# Patient Record
Sex: Male | Born: 1990
Health system: Southern US, Community
[De-identification: ages and names within clinical notes are randomized; demographics above are authoritative.]

## PROBLEM LIST (undated history)

## (undated) DIAGNOSIS — F909 Attention-deficit hyperactivity disorder, unspecified type: Secondary | ICD-10-CM

## (undated) HISTORY — DX: Attention-deficit hyperactivity disorder, unspecified type: F90.9

---
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
## 2011-01-25 ENCOUNTER — Other Ambulatory Visit: Payer: Self-pay | Admitting: Family Medicine

## 2011-01-25 MED ORDER — AMPHETAMINE-DEXTROAMPHET ER 30 MG PO CP24: 60.0000 mg | ORAL_CAPSULE | ORAL | Status: DC

## 2011-01-25 NOTE — Telephone Encounter (Signed)
Refill adderall - patient will pick up - Friday [id number]

## 2011-01-25 NOTE — Telephone Encounter (Signed)
Rx left at check in.      KP 

## 2011-02-27 ENCOUNTER — Other Ambulatory Visit: Payer: Self-pay | Admitting: Family Medicine

## 2011-02-28 MED ORDER — AMPHETAMINE-DEXTROAMPHET ER 30 MG PO CP24: 60.0000 mg | ORAL_CAPSULE | ORAL | Status: DC

## 2011-02-28 NOTE — Telephone Encounter (Signed)
Rx left at check In    KP 

## 2011-04-02 ENCOUNTER — Other Ambulatory Visit: Payer: Self-pay | Admitting: Family Medicine

## 2011-04-02 MED ORDER — AMPHETAMINE-DEXTROAMPHET ER 30 MG PO CP24: 60.0000 mg | ORAL_CAPSULE | ORAL | Status: DC

## 2011-04-02 NOTE — Telephone Encounter (Signed)
Refill adderall xr 30 mg - 2 tablets daily - patient will pick up Wednesday [id number]

## 2011-04-02 NOTE — Telephone Encounter (Signed)
Printed and left at check in    KP 

## 2011-05-03 ENCOUNTER — Telehealth: Payer: Self-pay | Admitting: Family Medicine

## 2011-05-03 NOTE — Telephone Encounter (Signed)
Refill adderall - patient will pick up Friday afternoon - [id number] °

## 2011-05-04 MED ORDER — AMPHETAMINE-DEXTROAMPHET ER 30 MG PO CP24: 60.0000 mg | ORAL_CAPSULE | ORAL | Status: DC

## 2011-05-04 NOTE — Telephone Encounter (Signed)
Rx left at check    KP 

## 2012-02-07 ENCOUNTER — Other Ambulatory Visit (HOSPITAL_COMMUNITY)
Admission: RE | Admit: 2012-02-07 | Discharge: 2012-02-07 | Disposition: A | Discharge status: Home / Self Care | Payer: Medicaid Other | Source: Ambulatory Visit | Attending: Family Medicine | Admitting: Family Medicine

## 2012-02-07 ENCOUNTER — Ambulatory Visit (INDEPENDENT_AMBULATORY_CARE_PROVIDER_SITE_OTHER): Payer: Medicaid Other | Admitting: Family Medicine

## 2012-02-07 ENCOUNTER — Encounter: Payer: Self-pay | Admitting: Family Medicine

## 2012-02-07 VITALS — BP 136/67 | HR 83 | Ht 74.0 in | Wt 160.0 lb

## 2012-02-07 DIAGNOSIS — F909 Attention-deficit hyperactivity disorder, unspecified type: Secondary | ICD-10-CM

## 2012-02-07 DIAGNOSIS — R198 Other specified symptoms and signs involving the digestive system and abdomen: Secondary | ICD-10-CM

## 2012-02-07 DIAGNOSIS — Z202 Contact with and (suspected) exposure to infections with a predominantly sexual mode of transmission: Secondary | ICD-10-CM

## 2012-02-07 DIAGNOSIS — Z2089 Contact with and (suspected) exposure to other communicable diseases: Secondary | ICD-10-CM

## 2012-02-07 DIAGNOSIS — F17200 Nicotine dependence, unspecified, uncomplicated: Secondary | ICD-10-CM

## 2012-02-07 DIAGNOSIS — Z720 Tobacco use: Secondary | ICD-10-CM

## 2012-02-07 DIAGNOSIS — Z113 Encounter for screening for infections with a predominantly sexual mode of transmission: Secondary | ICD-10-CM | POA: Insufficient documentation

## 2012-02-07 DIAGNOSIS — Z20828 Contact with and (suspected) exposure to other viral communicable diseases: Secondary | ICD-10-CM

## 2012-02-07 MED ORDER — AMPHETAMINE-DEXTROAMPHETAMINE 20 MG PO TABS: 20.0000 mg | ORAL_TABLET | Freq: Two times a day (BID) | ORAL | Status: DC

## 2012-02-07 NOTE — Patient Instructions (Signed)
It was great to see you today! I would like you to start taking 20mg  of the immediate release Adderall two times per day (morning and early afternoon).  Come back to see me in 1 month so we can discuss how things are going with this.

## 2012-02-07 NOTE — Assessment & Plan Note (Signed)
I counseled the patient that is important for [place] to start thinking about quitting. Patient is not open to this at this time.

## 2012-02-07 NOTE — Progress Notes (Signed)
Patient ID: Bob Hodges, male   DOB: [birth year], 21 y.o.   MRN: [id number] Subjective: The patient is a 21 y.o. year old male who presents today for new patient.  1. ADD: Diagnosed in 3rd grade.  Was placed on medication and had been on this on and off for many years.  Adderal was for a year but then lost insurance.  Was doing well on this med.  Currently has problems with inattention. This is most noticeable when he is at school.  2. STD check: Patient is currently sexually active with one male partner. Other than occasional abdominal discomfort he is not any specific symptoms at this time. He believes that his partner is faithful. He would, however, like testing for STDs. He has been sexually active with other individuals in the past. He denies any IV drug use.  Patient's past medical, social, and family history were reviewed and updated as appropriate. History  Substance Use Topics  . Smoking status: Current Everyday Smoker  . Smokeless tobacco: Never Used  . Alcohol Use: No   Objective:  Filed Vitals:   02/07/12 1408  BP: 136/67  Pulse: 83   Gen: No acute distress, well-developed well-nourished HEENT: Mucous members moist, extraocular movements intact, pupils were round reactive to light, good dentition CV: Regular rate and rhythm, no murmurs appreciated Resp: Clear to auscultation bilaterally Abdomen: Soft, nontender, nondistended Ext: No edema, 2+ pulses  Assessment/Plan:  I will obtain the appropriate test to screen for sexually transmitted infections and will contact patient if they're positive.  Please also see individual problems in problem list for problem-specific plans.

## 2012-02-07 NOTE — Assessment & Plan Note (Signed)
Patient has a history is consistent with the diagnosis of ADHD. I will go ahead and restart medication for him. I will start at 20 mg immediate release Adderall 2 times daily as the patient reported that previously his problems with the extended release wearing off and taking that 2 times daily was causing significant tablets with sleeping at night. We'll see him back in one month.

## 2012-02-08 LAB — HIV ANTIBODY (ROUTINE TESTING W REFLEX): HIV: NONREACTIVE

## 2012-02-13 ENCOUNTER — Telehealth: Payer: Self-pay | Admitting: Family Medicine

## 2012-02-13 MED ORDER — AZITHROMYCIN 250 MG PO TABS: ORAL_TABLET | ORAL | Status: AC

## 2012-02-13 NOTE — Telephone Encounter (Signed)
+  Chl, -GC.  Sent in Azithro 1 gm.  Tried to call patient but was sent to voicemail.  Left message requesting that he call back at his earliest convenience.  His partner will need to be treated and he will need repeat testing of both Chl/GC and HIV/RPR in 3-6 months.

## 2012-02-13 NOTE — Telephone Encounter (Signed)
Spoke with patient and advised of message from Dr. Louanne Belton. Advised to abstain from sex for 7 days  also.  Communicable Disease report faxed to [facility].

## 2012-03-14 ENCOUNTER — Ambulatory Visit (INDEPENDENT_AMBULATORY_CARE_PROVIDER_SITE_OTHER): Payer: Medicaid Other | Admitting: Family Medicine

## 2012-03-14 ENCOUNTER — Other Ambulatory Visit (HOSPITAL_COMMUNITY)
Admission: RE | Admit: 2012-03-14 | Discharge: 2012-03-14 | Disposition: A | Discharge status: Home / Self Care | Payer: Medicaid Other | Source: Ambulatory Visit | Attending: Family Medicine | Admitting: Family Medicine

## 2012-03-14 ENCOUNTER — Encounter: Payer: Self-pay | Admitting: Family Medicine

## 2012-03-14 VITALS — BP 123/66 | HR 69 | Ht 74.0 in | Wt 162.1 lb

## 2012-03-14 DIAGNOSIS — L708 Other acne: Secondary | ICD-10-CM

## 2012-03-14 DIAGNOSIS — Z113 Encounter for screening for infections with a predominantly sexual mode of transmission: Secondary | ICD-10-CM | POA: Insufficient documentation

## 2012-03-14 DIAGNOSIS — R198 Other specified symptoms and signs involving the digestive system and abdomen: Secondary | ICD-10-CM

## 2012-03-14 DIAGNOSIS — F909 Attention-deficit hyperactivity disorder, unspecified type: Secondary | ICD-10-CM

## 2012-03-14 MED ORDER — AMPHETAMINE-DEXTROAMPHETAMINE 20 MG PO TABS: 20.0000 mg | ORAL_TABLET | Freq: Two times a day (BID) | ORAL | Status: DC

## 2012-03-14 MED ORDER — DOXYCYCLINE HYCLATE 100 MG PO TABS: 100.0000 mg | ORAL_TABLET | Freq: Two times a day (BID) | ORAL | Status: AC

## 2012-03-14 NOTE — Assessment & Plan Note (Signed)
Appears well controlled with no side effects.  COntinue current meds.  Give 3 months of refills.

## 2012-03-14 NOTE — Assessment & Plan Note (Signed)
Cystic acne.  Previously treated with systemic antibiotics.  Feel this is reasonible firsta pproach.  Will do this for three months and then see if can be transitioned to topicals.  Risks and benefits discussed with patient who wishes to proceed with systemic treatment.

## 2012-03-14 NOTE — Progress Notes (Signed)
Patient ID: Yosmar Ryker, male   DOB: 06/28/1991, 21 y.o.   MRN: [id number] Subjective: The patient is a 21 y.o. year old male who presents today for f/u.  1. GC/Chl: treatment of both him and partner performed at same time.  No recurrent symptoms.  2. Acne:  Has had significant problems with this in the past.  Was previously tried on multiple topicals and eventually had to have 1 year of doxy to bring under control.  Now is having recurrence of symptoms.  Cystic acne of neck, jaw, and upper back.  3. ADHD: School going well.  Feels dose of meds are adequate.  Concentration is OK.  Appetitie is OK.  Patient's past medical, social, and family history were reviewed and updated as appropriate. History  Substance Use Topics  . Smoking status: Current Every Day Smoker -- 0.3 packs/day for 4 years  . Smokeless tobacco: Never Used  . Alcohol Use: No   Objective:  Filed Vitals:   03/14/12 1604  BP: 123/66  Pulse: 69   Gen: NAD Skin: multiple cystic lesions on the neck and upper back.  Also present along the hairline in the back of neck.  Assessment/Plan: Test of cure today for GC/Chl.  Please also see individual problems in problem list for problem-specific plans.

## 2012-03-14 NOTE — Patient Instructions (Signed)
It was great to see you today! We are testing you again today to make certain the antibiotics I gave you worked. I am giving you 3 months worth of Adderall. I am also giving you 3 months of doxycyclin for your acne.

## 2012-03-20 ENCOUNTER — Encounter: Payer: Self-pay | Admitting: Family Medicine

## 2012-07-15 ENCOUNTER — Other Ambulatory Visit: Payer: Self-pay | Admitting: Family Medicine

## 2012-07-15 NOTE — Telephone Encounter (Signed)
Patient has next appt on 1/22 and is hoping that he can get a refill on his Concerta at least enough to last until that appt.

## 2012-07-16 NOTE — Telephone Encounter (Signed)
Unfortunately the answer is no.  Scheduled medications are only given in office appointments.

## 2012-07-17 NOTE — Telephone Encounter (Signed)
Scheduling him with someone else is fine.  The rule with the PCP is only for narcotics and benzodiazepines by my understanding.  It is just our clinic policy not to give refills on this type of medication (most of the ADHD meds are Schedule 2, therefore the same federal policies as MS Contin and similar meds).

## 2012-07-17 NOTE — Telephone Encounter (Signed)
Bob Hodges, is it ok to schedule him with someone else sooner?  I know that meds are to be refilled by their PCP, but if you say this is ok, I will get him in with someone else sooner.  That is unless I can double book you twice in a clinic.

## 2012-07-17 NOTE — Telephone Encounter (Signed)
I attempted to call patient to let him know that he has to be seen before his medication can be refilled and to see if he wanted to schedule to be seen by a different MD.  But, the phone number that I was given the other day, is no longer a number where he can be reached.

## 2012-07-23 ENCOUNTER — Ambulatory Visit: Payer: Medicaid Other | Admitting: Family Medicine

## 2012-07-23 ENCOUNTER — Telehealth: Payer: Self-pay | Admitting: *Deleted

## 2012-07-23 NOTE — Telephone Encounter (Signed)
Called and lvm for pt to call back to reschd appt due to pcp out sick.Loralee Pacas [place]

## 2012-08-08 ENCOUNTER — Encounter: Payer: Self-pay | Admitting: Family Medicine

## 2012-08-08 ENCOUNTER — Ambulatory Visit (INDEPENDENT_AMBULATORY_CARE_PROVIDER_SITE_OTHER): Payer: Medicaid Other | Admitting: Family Medicine

## 2012-08-08 VITALS — BP 114/71 | HR 94 | Temp 99.0°F | Ht 74.0 in | Wt 162.0 lb

## 2012-08-08 DIAGNOSIS — F909 Attention-deficit hyperactivity disorder, unspecified type: Secondary | ICD-10-CM

## 2012-08-08 MED ORDER — AMPHETAMINE-DEXTROAMPHETAMINE 20 MG PO TABS: 20.0000 mg | ORAL_TABLET | Freq: Two times a day (BID) | ORAL | Status: DC

## 2012-08-08 NOTE — Assessment & Plan Note (Signed)
Refills provided. Patient instructed to call with any problems. Otherwise return to clinic for refills in 3 months.

## 2012-08-08 NOTE — Patient Instructions (Signed)
It was great to see you today! Let me know if you have any problems with your medications. Come back to see me in 3 months for refills if you are not having any problems.

## 2012-08-08 NOTE — Progress Notes (Signed)
Patient ID: Bob Hodges, male   DOB: June 23, 1991, 22 y.o.   MRN: [id number] Subjective: The patient is a 22 y.o. year old male who presents today for ADHD.  1. ADHD: Patient was on Adderall throughout semester last semester. Reports it helped his crit significantly. He had 4.0 last semester. His out of the medications and would like refills. He did not note any particular side effects from the medicines. He reports good appetite, no chest pain palpitations, and no changes in weight.  Patient's past medical, social, and family history were reviewed and updated as appropriate. History  Substance Use Topics  . Smoking status: Current Every Day Smoker -- 0.3 packs/day for 4 years  . Smokeless tobacco: Never Used  . Alcohol Use: No   Objective:  Filed Vitals:   08/08/12 1611  BP: 114/71  Pulse: 94  Temp: 99 F (37.2 C)   Gen: NAD CV: RRR Resp: CTABL  Assessment/Plan:  Please also see individual problems in problem list for problem-specific plans.

## 2012-11-24 ENCOUNTER — Encounter: Payer: Self-pay | Admitting: *Deleted

## 2012-11-24 ENCOUNTER — Emergency Department
Admission: EM | Admit: 2012-11-24 | Discharge: 2012-11-24 | Disposition: A | Discharge status: Home / Self Care | Payer: Medicaid Other | Source: Home / Self Care | Attending: Family Medicine | Admitting: Family Medicine

## 2012-11-24 DIAGNOSIS — J069 Acute upper respiratory infection, unspecified: Secondary | ICD-10-CM

## 2012-11-24 DIAGNOSIS — J029 Acute pharyngitis, unspecified: Secondary | ICD-10-CM

## 2012-11-24 DIAGNOSIS — Z716 Tobacco abuse counseling: Secondary | ICD-10-CM

## 2012-11-24 DIAGNOSIS — W57XXXA Bitten or stung by nonvenomous insect and other nonvenomous arthropods, initial encounter: Secondary | ICD-10-CM

## 2012-11-24 LAB — POCT RAPID STREP A (OFFICE): Rapid Strep A Screen: NEGATIVE

## 2012-11-24 MED ORDER — DOXYCYCLINE HYCLATE 100 MG PO CAPS: 100.0000 mg | ORAL_CAPSULE | Freq: Two times a day (BID) | ORAL | Status: AC

## 2012-11-24 NOTE — ED Provider Notes (Signed)
History     CSN: [id number]  Arrival date & time 11/24/12  [id number]   First MD Initiated Contact with Patient 11/24/12 [phone]      Chief Complaint  Patient presents with  . Sore Throat   HPI  SORE THROAT  Onset: 1 week  Description: sore throat  Modifying factors: went camping to music festival. No rash, body aches. + tick exposure. Removed last week.    Symptoms  Fever:  no URI symptoms: sinus drainage; post nasal drip  Cough: yes Headache: no Rash:  no Swollen glands:   no Recent Strep Exposure: no LUQ pain: no Heartburn/brash: no Allergy Symptoms: no  Red Flags STD exposure: no Breathing difficulty: no Drooling: no Trismus: no   Past Medical History  Diagnosis Date  . ADHD (attention deficit hyperactivity disorder)     History reviewed. No pertinent past surgical history.  Family History  Problem Relation Age of Onset  . Lupus Mother     History  Substance Use Topics  . Smoking status: Current Every Day Smoker -- 0.30 packs/day for 4 years  . Smokeless tobacco: Never Used  . Alcohol Use: No      Review of Systems  All other systems reviewed and are negative.    Allergies  Review of patient's allergies indicates no known allergies.  Home Medications   Current Outpatient Rx  Name  Route  Sig  Dispense  Refill  . amphetamine-dextroamphetamine (ADDERALL) 20 MG tablet   Oral   Take 1 tablet (20 mg total) by mouth 2 (two) times daily.   60 tablet   0   . amphetamine-dextroamphetamine (ADDERALL) 20 MG tablet   Oral   Take 1 tablet (20 mg total) by mouth 2 (two) times daily. Fill 30 days from written date   60 tablet   0   . amphetamine-dextroamphetamine (ADDERALL) 20 MG tablet   Oral   Take 1 tablet (20 mg total) by mouth 2 (two) times daily. Fill 60 days from written date   60 tablet   0     BP 131/76  Pulse 61  Temp(Src) 97.6 F (36.4 C) (Oral)  Resp 16  Wt 152 lb (68.947 kg)  BMI 19.51 kg/m2  SpO2 100%  Physical Exam   Constitutional: He appears well-developed and well-nourished.  HENT:  Head: Normocephalic and atraumatic.  +nasal erythema, rhinorrhea bilaterally, + post oropharyngeal erythema    Eyes: Conjunctivae are normal. Pupils are equal, round, and reactive to light.  Neck: Normal range of motion. Neck supple.  Cardiovascular: Normal rate, regular rhythm and normal heart sounds.   Pulmonary/Chest: Effort normal and breath sounds normal.  Abdominal: Soft.  Musculoskeletal: Normal range of motion.  Lymphadenopathy:    He has no cervical adenopathy.  Neurological: He is alert.  Skin: Skin is warm.    ED Course  Procedures (including critical care time)  Labs Reviewed  STREP A DNA PROBE  MONONUCLEOSIS SCREEN  ROCKY MTN SPOTTED FVR AB, IGG-BLOOD  ROCKY MTN SPOTTED FVR AB, IGM-BLOOD  POCT RAPID STREP A (OFFICE)  POCT MONO SCREEN ([facility])   No results found.   1. Pharyngitis       MDM  Rapid strep negative.  Mono negative.  Will perform strep culture.  Likely viral illness Will start on doxy pending strep culture given recent camping.  RMSF titers obtained.  Discussed infectious and general red flags.  Discussed smoking cessation.  Follow up as needed.     The patient and/or caregiver has  been counseled thoroughly with regard to treatment plan and/or medications prescribed including dosage, schedule, interactions, rationale for use, and possible side effects and they verbalize understanding. Diagnoses and expected course of recovery discussed and will return if not improved as expected or if the condition worsens. Patient and/or caregiver verbalized understanding.          Doree Albee, MD 11/24/12 [phone]

## 2012-11-24 NOTE — ED Notes (Signed)
Pt c/o sore throat and fever x 1wk. He has taken IBF prn.

## 2012-11-25 LAB — STREP A DNA PROBE: GASP: NEGATIVE

## 2012-12-09 ENCOUNTER — Encounter: Payer: Self-pay | Admitting: Family Medicine

## 2012-12-09 ENCOUNTER — Telehealth: Payer: Self-pay | Admitting: Family Medicine

## 2012-12-09 ENCOUNTER — Ambulatory Visit (INDEPENDENT_AMBULATORY_CARE_PROVIDER_SITE_OTHER): Payer: Medicaid Other | Admitting: Family Medicine

## 2012-12-09 VITALS — BP 124/78 | HR 80 | Temp 98.0°F | Ht 73.0 in | Wt 151.7 lb

## 2012-12-09 DIAGNOSIS — F909 Attention-deficit hyperactivity disorder, unspecified type: Secondary | ICD-10-CM

## 2012-12-09 MED ORDER — AMPHETAMINE-DEXTROAMPHETAMINE 20 MG PO TABS: 20.0000 mg | ORAL_TABLET | Freq: Two times a day (BID) | ORAL | Status: DC

## 2012-12-09 NOTE — Telephone Encounter (Signed)
Patient seen today, because of Medicaid, Adderall rx will need a pre-auth.

## 2012-12-09 NOTE — Assessment & Plan Note (Signed)
Med refills given

## 2012-12-09 NOTE — Progress Notes (Signed)
Patient ID: Bob Hodges, male   DOB: August 26, 1990, 22 y.o.   MRN: [id number] Subjective: The patient is a 22 y.o. year old male who presents today for refills.  1. ADHD: Patient reports his concentration is good on his current dose of Adderall. He reports no problems with his appetite. He reports no side effects medications. He is on time for refills.  Patient's past medical, social, and family history were reviewed and updated as appropriate. History  Substance Use Topics  . Smoking status: Current Every Day Smoker -- 0.30 packs/day for 4 years  . Smokeless tobacco: Never Used  . Alcohol Use: No   Objective:  Filed Vitals:   12/09/12 1518  BP: 124/78  Pulse: 80  Temp: 98 F (36.7 C)   Gen: No acute distress HEENT: Hours moist, extraocular was intact, tympanic membranes normal bilaterally CV: Regular rate and rhythm Resp: Clear to auscultation bilaterally  Assessment/Plan:  Please also see individual problems in problem list for problem-specific plans.

## 2012-12-09 NOTE — Patient Instructions (Signed)
It was great to see you today! I have refilled your medications. Plan on coming back in about 3 months.

## 2013-04-28 ENCOUNTER — Encounter (HOSPITAL_COMMUNITY): Payer: Self-pay | Admitting: Emergency Medicine

## 2013-04-28 ENCOUNTER — Emergency Department (HOSPITAL_COMMUNITY)
Admission: EM | Admit: 2013-04-28 | Discharge: 2013-04-28 | Disposition: A | Discharge status: Home / Self Care | Payer: Worker's Compensation | Attending: Emergency Medicine | Admitting: Emergency Medicine

## 2013-04-28 DIAGNOSIS — T7840XA Allergy, unspecified, initial encounter: Secondary | ICD-10-CM

## 2013-04-28 DIAGNOSIS — L509 Urticaria, unspecified: Secondary | ICD-10-CM | POA: Insufficient documentation

## 2013-04-28 DIAGNOSIS — Z8659 Personal history of other mental and behavioral disorders: Secondary | ICD-10-CM | POA: Insufficient documentation

## 2013-04-28 DIAGNOSIS — F17200 Nicotine dependence, unspecified, uncomplicated: Secondary | ICD-10-CM | POA: Insufficient documentation

## 2013-04-28 DIAGNOSIS — T783XXA Angioneurotic edema, initial encounter: Secondary | ICD-10-CM

## 2013-04-28 NOTE — ED Notes (Signed)
Pt ambulatory to exam room with steady gait.  

## 2013-04-28 NOTE — ED Provider Notes (Signed)
CSN: [id number]     Arrival date & time 04/28/13  2043 History  This chart was scribed for Junius Finner, PA, working with Audree Camel, MD, by Ardelia Mems ED Scribe. This patient was seen in room WTR8/WTR8 and the patient's care was started at 10:47 PM.   Chief Complaint  Patient presents with  . Allergic Reaction    The history is provided by the patient. No language interpreter was used.    HPI Comments: Bob Hodges is a 22 y.o. male who presents to the Emergency Department complaining of a suspected allergic reaction onset PTA. He states that he working at [employer] and that he began having SOB and a cough while working. He also states that he began having itching generalized hives over his entire body. He states that he has taken Benadryl with relief of symptoms, but that his hives are still present. He states that he has not hand any new foods or medications today. He states that his only known allergy is certain detergents.  PCP- Dr. Gildardo Cranker   Past Medical History  Diagnosis Date  . ADHD (attention deficit hyperactivity disorder)    History reviewed. No pertinent past surgical history. Family History  Problem Relation Age of Onset  . Lupus Mother    History  Substance Use Topics  . Smoking status: Current Every Day Smoker -- 0.50 packs/day for 4 years    Types: Cigarettes  . Smokeless tobacco: Not on file  . Alcohol Use: Yes    Review of Systems  Respiratory: Positive for cough (subsided) and shortness of breath (subsided).   Skin: Positive for rash.  All other systems reviewed and are negative.   Allergies  Review of patient's allergies indicates no known allergies.  Home Medications   Current Outpatient Rx  Name  Route  Sig  Dispense  Refill  . ibuprofen (ADVIL,MOTRIN) 200 MG tablet   Oral   Take 200 mg by mouth every 6 (six) hours as needed for pain (PAIN).          Triage Vitals: BP 104/64  Pulse 74  Temp(Src) 97.7 F (36.5 C) (Oral)  Resp  18  Ht 6\' 1"  (1.854 m)  Wt 155 lb (70.308 kg)  BMI 20.45 kg/m2  SpO2 100%  Physical Exam  Nursing note and vitals reviewed. Constitutional: He appears well-developed and well-nourished.  HENT:  Head: Normocephalic and atraumatic.  Eyes: Conjunctivae are normal. No scleral icterus.  Neck: Normal range of motion.  Cardiovascular: Normal rate, regular rhythm and normal heart sounds.   Pulmonary/Chest: Effort normal and breath sounds normal. No respiratory distress. He has no wheezes. He has no rales. He exhibits no tenderness.  No respiratory distress. Lungs CTAB. No wheezes or stridor.  Abdominal: Soft. He exhibits no distension. There is no tenderness.  Musculoskeletal: Normal range of motion.  Neurological: He is alert.  Skin: Skin is warm and dry. Rash noted.  Diffuse hives- Small erythemic wheals on torso. Large erythemic coalescing wheals on posterior thighs and buttocks bilaterally.    ED Course  Procedures (including critical care time)  DIAGNOSTIC STUDIES: Oxygen Saturation is 100% on RA, normal by my interpretation.    COORDINATION OF CARE: 10:54 PM- Advised pt to continue taking Benadryl every 4-6 hours. Pt advised of plan for treatment and pt agrees.  Labs Review Labs Reviewed - No data to display Imaging Review No results found.  EKG Interpretation   None       MDM   1.  Allergic reaction, initial encounter   2. Giant hives, initial encounter    Pt presented with diffuse hives due to unknown allergen.  Pt reports significant improvement after OTC benadryl 25mg  PTA.  Pt still has hives present however lungs: CTAB, pt appears well. Denies difficulty breathing or swallowing.  Advised pt he can take benadryl every 4-6 hours as needed for continued hives and itching. Strict return precautions provided. Pt verbalized understanding and agreement with tx plan.   I personally performed the services described in this documentation, which was scribed in my presence.  The recorded information has been reviewed and is accurate.    Junius Finner, PA-C 04/29/13 [phone]

## 2013-04-28 NOTE — ED Notes (Signed)
Pt reports allergic reaction to something unknown, stated he inititally felt ShOB in a [employer] truck at work, reported itching to his bilateral legs, and hives to his R arm and bilateral legs and torso.  Pt went home to take a shower, and took 25 mg Benadryl. Denies ShOB at this time

## 2013-04-29 NOTE — ED Provider Notes (Signed)
Medical screening examination/treatment/procedure(s) were performed by non-physician practitioner and as supervising physician I was immediately available for consultation/collaboration.  EKG Interpretation   None         Audree Camel, MD 04/29/13 1109

## 2016-03-22 ENCOUNTER — Encounter: Payer: Self-pay | Admitting: Family Medicine

## 2016-03-22 ENCOUNTER — Ambulatory Visit (INDEPENDENT_AMBULATORY_CARE_PROVIDER_SITE_OTHER): Payer: Managed Care, Other (non HMO) | Admitting: Family Medicine

## 2016-03-22 VITALS — BP 142/86 | HR 80 | Temp 99.0°F | Ht 74.0 in | Wt 179.4 lb

## 2016-03-22 DIAGNOSIS — M791 Myalgia, unspecified site: Secondary | ICD-10-CM | POA: Insufficient documentation

## 2016-03-22 DIAGNOSIS — L720 Epidermal cyst: Secondary | ICD-10-CM | POA: Diagnosis not present

## 2016-03-22 DIAGNOSIS — M2560 Stiffness of unspecified joint, not elsewhere classified: Secondary | ICD-10-CM

## 2016-03-22 DIAGNOSIS — F4010 Social phobia, unspecified: Secondary | ICD-10-CM | POA: Insufficient documentation

## 2016-03-22 DIAGNOSIS — J302 Other seasonal allergic rhinitis: Secondary | ICD-10-CM

## 2016-03-22 DIAGNOSIS — IMO0002 Reserved for concepts with insufficient information to code with codable children: Secondary | ICD-10-CM

## 2016-03-22 DIAGNOSIS — Z113 Encounter for screening for infections with a predominantly sexual mode of transmission: Secondary | ICD-10-CM

## 2016-03-22 MED ORDER — PROPRANOLOL HCL 10 MG PO TABS: 10.0000 mg | ORAL_TABLET | Freq: Two times a day (BID) | ORAL | 1 refills | Status: DC

## 2016-03-22 MED ORDER — LEVOCETIRIZINE DIHYDROCHLORIDE 5 MG PO TABS: 5.0000 mg | ORAL_TABLET | Freq: Every evening | ORAL | 11 refills | Status: DC

## 2016-03-22 MED ORDER — DOXYCYCLINE HYCLATE 100 MG PO TABS: 100.0000 mg | ORAL_TABLET | Freq: Two times a day (BID) | ORAL | 0 refills | Status: DC

## 2016-03-22 NOTE — Progress Notes (Signed)
[facility] [address], [place], [place] [address] [phone]  Date:  03/22/2016   Name:  Bob Hodges   DOB:  1990-08-31   MRN:  [id number]  PCP:  Carlyle Dolly, MD    Chief Complaint: Establish Care (Pt here to est care. declined flu, would like tetanus. would like labs. c/o muscle stiffness, cyst in groin area.  would like to discuss anxiety. )   History of Present Illness:  Bob Hodges is a 25 y.o. very pleasant male patient who presents with the following:  Here today as a new patient.  He has been on adderall in the past for ADHD.  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  He is from [place] originally and moved here about 17 years ago.    He states that as a child he was dx with ADHD due to "short term memory issues" and took adderall for some time.  However at this time he does not take medication for attention, "I just deal with it"  He does have some social anxiety symptoms.  He notes that "i have a hard time interacting with people, it gets to the point where I'm physically shaking."  He only really has sx when he goes out, he will feel shaky and sweaty.  He does not get nervous around familiar people in a low pressure situation such as at work  He enjoys spending a lot of time o- line and plays video games a lot in his free time.   He does not really feel like he is depressed.   He broke up with a long term GF earlier this year- he feels like he got through this ok.  No SI.   He has noted a "cyst" in his right groin for a couple of days.  It came up suddenly- was just there one morning He has a history of cystic acne, and thinks that is what this is It is tender still but not draining He has not noted any fever at home.  No aches or chills He is able to urinate ok  He used to work at [employer], but stopped working there due to back pain. He will notice that his muscles are tight, will often cramp up.   He uses ibuprofen daily.  He wonders if he has some sort of vitamin deficiency or other systemic cause of his muscle pains He uses magnesium that does help some- he also tried potassium that did not make a differnece His mother does have lupus  He is not sure of the date of his last teatnus- would like to update   Patient Active Problem List   Diagnosis Date Noted  . Tobacco abuse 02/07/2012  . ACNE VULGARIS 06/05/2010  . ADHD 04/13/2008    Past Medical History:  Diagnosis Date  . ADHD (attention deficit hyperactivity disorder)     No past surgical history on file.  Social History  Substance Use Topics  . Smoking status: Current Every Day Smoker    Packs/day: 0.50    Years: 4.00    Types: Cigarettes  . Smokeless tobacco: Not on file  . Alcohol use Yes    Family History  Problem Relation Age of Onset  . Lupus Mother     No Known Allergies  Medication list has been reviewed and updated.  Current Outpatient Prescriptions on File Prior to Visit  Medication Sig Dispense Refill  . ibuprofen (  ADVIL,MOTRIN) 200 MG tablet Take 200 mg by mouth every 6 (six) hours as needed for pain (PAIN).     No current facility-administered medications on file prior to visit.     Review of Systems:  As per HPI- otherwise negative.  No chest pain, SOB, nausea, vomiting, ST, wheezing He does not have a history of asthma or any cardiac problems    Physical Examination: Vitals:   03/22/16 1721 03/22/16 1726  BP: (!) 153/96 (!) 142/86  Pulse: 80   Temp: 99 F (37.2 C)    Vitals:   03/22/16 1721  Weight: 179 lb 6.4 oz (81.4 kg)  Height: _0  (1.88 m)   Body mass index is 23.03 kg/m. Ideal Body Weight: Weight in (lb) to have BMI = 25: 194.3  GEN: WDWN, NAD, Non-toxic, A & O x 3, normal weight, looks well HEENT: Atraumatic, Normocephalic. Neck supple. No masses, No LAD.  Bilateral TM wnl, oropharynx normal.  PEERL,EOMI.   Ears and Nose: No external deformity. CV: RRR, No  M/G/R. No JVD. No thrill. No extra heart sounds. PULM: CTA B, no wheezes, crackles, rhonchi. No retractions. No resp. distress. No accessory muscle use. ABD: S, NT, ND EXTR: No c/c/e NEURO Normal gait.  PSYCH: Normally interactive. Conversant. Not depressed or anxious appearing.  Calm demeanor.  Groin: there is a firm mass overlying the right inguinal canal approx 1 x 2cm in size.  It is firm, somewhat rubbery, tender.  It is deep and I am not convinced that this is an abscess- it may be a node.  It does not point, no redness, heat or drainage.   No genital lesions or sores   Assessment and Plan: Social anxiety disorder - Plan: propranolol (INDERAL) 10 MG tablet  Muscle pain - Plan: TSH, CBC, Sed Rate (ESR), C-reactive protein, HIV antibody, ANA, Magnesium, Vitamin D (25 hydroxy)  Stiffness in joint - Plan: Comprehensive metabolic panel, TSH, CBC, Sed Rate (ESR), C-reactive protein, HIV antibody, ANA, Magnesium, Vitamin D (25 hydroxy)  Inguinal cyst - Plan: doxycycline (VIBRA-TABS) 100 MG tablet  Other seasonal allergic rhinitis - Plan: levocetirizine (XYZAL) 5 MG tablet  Routine screening for STI (sexually transmitted infection) - Plan: Hepatitis B surface antibody, Hepatitis B surface antigen, Hepatitis C antibody, RPR  Here today to establish care and discuss a couple of concerns Routine STI blood work today He has noted muscle pain and cramping.  This is concerning to him and he would like to do some testing to try and determine any etiology.  Will start with labs as above Inguinal mass- as above, I am not convinced that this is an abscess and as such did not do an I and D.  Will start on doxycycline and observe closely.  If it does become an obvious abscess will plan to drain- he is also advised to let me know if the mass remains as this may indicate a node  Will try propranolol for social anxiety on an as needed basis Will plan further follow- up pending labs.  Meds ordered this  encounter  Medications  . doxycycline (VIBRA-TABS) 100 MG tablet    Sig: Take 1 tablet (100 mg total) by mouth 2 (two) times daily.    Dispense:  20 tablet    Refill:  0  . levocetirizine (XYZAL) 5 MG tablet    Sig: Take 1 tablet (5 mg total) by mouth every evening.    Dispense:  30 tablet    Refill:  11  .  propranolol (INDERAL) 10 MG tablet    Sig: Take 1 tablet (10 mg total) by mouth 2 (two) times daily. Use as needed for social anxiety    Dispense:  30 tablet    Refill:  1     Signed Lamar Blinks, MD

## 2016-03-22 NOTE — Patient Instructions (Addendum)
Keep a close eye on the mass in your groin- I am not sure at this time if it is an abscess or a lymph node.  We will treat you with an antibiotic (doxycycline) twice a day for 10days, and use hot compresses.  If it seems to be developing into a more superficial abscess we can drain it for you here.  However, I hope this will go away without needing to be drained.   Continue to use xyzal as needed for allergies- if this is expensive you might try a generic claritin or zyrtec  Try the propranolol as needed for social anxiety.  Take it about 30 minutes before you go out- this should help with shakiness, racing heart, and other symptoms. Let me know how this does for you  We will get some labs today in an attempt to find any cause for your body aches and stiffness- I will be in touch with your labs asap  Take care and keep me posted

## 2016-03-23 LAB — SEDIMENTATION RATE: Sed Rate: 1 mm/hr (ref 0–15)

## 2016-03-23 LAB — CBC
HEMATOCRIT: 44.4 % (ref 39.0–52.0)
Hemoglobin: 15.2 g/dL (ref 13.0–17.0)
MCHC: 34.3 g/dL (ref 30.0–36.0)
MCV: 94 fl (ref 78.0–100.0)
Platelets: 224 10*3/uL (ref 150.0–400.0)
RBC: 4.72 Mil/uL (ref 4.22–5.81)
RDW: 13.2 % (ref 11.5–15.5)
WBC: 11.3 10*3/uL — ABNORMAL HIGH (ref 4.0–10.5)

## 2016-03-23 LAB — MAGNESIUM: Magnesium: 1.9 mg/dL (ref 1.5–2.5)

## 2016-03-23 LAB — COMPREHENSIVE METABOLIC PANEL
ALT: 16 U/L (ref 0–53)
AST: 20 U/L (ref 0–37)
Albumin: 5 g/dL (ref 3.5–5.2)
Alkaline Phosphatase: 57 U/L (ref 39–117)
BILIRUBIN TOTAL: 1 mg/dL (ref 0.2–1.2)
BUN: 11 mg/dL (ref 6–23)
CALCIUM: 9.6 mg/dL (ref 8.4–10.5)
CO2: 27 meq/L (ref 19–32)
CREATININE: 0.97 mg/dL (ref 0.40–1.50)
Chloride: 100 mEq/L (ref 96–112)
GFR: 100.82 mL/min (ref >60.00)
GLUCOSE: 61 mg/dL — AB (ref 70–99)
Potassium: 4 mEq/L (ref 3.5–5.1)
Sodium: 138 mEq/L (ref 135–145)
Total Protein: 8.1 g/dL (ref 6.0–8.3)

## 2016-03-23 LAB — HIV ANTIBODY (ROUTINE TESTING W REFLEX): HIV 1&2 Ab, 4th Generation: NONREACTIVE

## 2016-03-23 LAB — VITAMIN D 25 HYDROXY (VIT D DEFICIENCY, FRACTURES): VITD: 44.04 ng/mL (ref 30.00–100.00)

## 2016-03-23 LAB — C-REACTIVE PROTEIN: CRP: 0.4 mg/dL — ABNORMAL LOW (ref 0.5–20.0)

## 2016-03-23 LAB — TSH: TSH: 0.82 u[IU]/mL (ref 0.35–4.50)

## 2016-03-24 LAB — HEPATITIS B SURFACE ANTIGEN: HEP B S AG: NEGATIVE

## 2016-03-24 LAB — HEPATITIS C ANTIBODY: HCV AB: NEGATIVE

## 2016-03-24 LAB — RPR

## 2016-03-24 LAB — HEPATITIS B SURFACE ANTIBODY, QUANTITATIVE: Hepatitis B-Post: <5 m[IU]/mL

## 2016-03-26 ENCOUNTER — Encounter: Payer: Self-pay | Admitting: Family Medicine

## 2016-03-26 ENCOUNTER — Telehealth: Payer: Self-pay | Admitting: Family Medicine

## 2016-03-26 LAB — ANA: Anti Nuclear Antibody(ANA): NEGATIVE

## 2016-03-26 NOTE — Telephone Encounter (Signed)
Relation to XB:JYNWpt:self   Reason for call:  Patient last seen 03/27/16 for cyst in groin, symtomps didn't improve requesting clinical advice. Patient unable to schedule appointment today due to transportation.

## 2016-03-26 NOTE — Telephone Encounter (Signed)
Was going over pts labs and saw that he had called.  Mass in right groin now seems to be an abscess for sure- it is red, hot and pointing.  He has no fever and otherwise "I feel fine.'  He will need to come in tomorrow for eval and I and D.  Will ask my nurse to call him and get him scheduled tomorrow with doc of the day as I am not in the office.  If we cannot work this out for him I advised him to be seen at a nearby urgent care clinic that will also be able to manage this problme

## 2016-03-27 NOTE — Telephone Encounter (Signed)
Spoke with patient he has went to urgent care see phone note in chart. PC

## 2016-03-27 NOTE — Telephone Encounter (Signed)
Spoke with patient he states he went to urgent care and had it drained some. Also states he will wait it out as far as staying on the antibiotics, keeping it clean, and watching it making sure it does not get any bigger or start changing in any way. Pt states he feels much better already, and will continue meds. PC

## 2016-05-23 ENCOUNTER — Encounter: Payer: Self-pay | Admitting: Family Medicine

## 2016-05-23 ENCOUNTER — Ambulatory Visit (INDEPENDENT_AMBULATORY_CARE_PROVIDER_SITE_OTHER): Payer: Managed Care, Other (non HMO) | Admitting: Family Medicine

## 2016-05-23 VITALS — BP 141/77 | HR 87 | Temp 98.3°F | Ht 74.0 in | Wt 181.6 lb

## 2016-05-23 DIAGNOSIS — R52 Pain, unspecified: Secondary | ICD-10-CM | POA: Diagnosis not present

## 2016-05-23 DIAGNOSIS — F411 Generalized anxiety disorder: Secondary | ICD-10-CM

## 2016-05-23 DIAGNOSIS — L600 Ingrowing nail: Secondary | ICD-10-CM | POA: Diagnosis not present

## 2016-05-23 DIAGNOSIS — M67432 Ganglion, left wrist: Secondary | ICD-10-CM | POA: Diagnosis not present

## 2016-05-23 MED ORDER — BUSPIRONE HCL 15 MG PO TABS: 15.0000 mg | ORAL_TABLET | Freq: Two times a day (BID) | ORAL | 3 refills | Status: DC

## 2016-05-23 NOTE — Progress Notes (Signed)
[facility] [address], [address] [place], [place] [phone] [phone]  Date:  05/23/2016   Name:  Bob Hodges   DOB:  12/30/1990   MRN:  [id number]  PCP:  Abbe AmsterdamOPLAND,Jefry Lesinski, MD    Chief Complaint: Follow-up (Pt here for follow visit. Started Propranolol last visit and pt states that he has not noticed much difference. c/o possible cyst on right wrist that flares up occ. Ingrown toenail on left big toe. )   History of Present Illness:  Bob Hodges is a 25 y.o. very pleasant male patient who presents with the following:  We started him on propranolol at our last visit (about 2 months ago) due to social anxiety.  He has taken this a few times on a prn basis-  He has noted continued concerns with anxiety- the propranolol does help some with the physical symptoms but did not curb the anxiety overall.  He still gets anxious in everyday situations such as shopping He is not feeling down or depressed at all Appetite is pretty good, his sleep is good, energy level is good.  His outlook is overall positive He does notice that his extremities will get cold very fast, however he does not notice any color change  He also will have some muscle pains that is worse some days than others.  This has gotten better   He will also sometimes notice a bulge and pain in the RIGH dorsal wrist that will wax and wane- it is sometimes visible and tender and can impede wrist extension.  Other times it seems to disappear.  He has never had this formally evaluated or treated  He also notes a problem with his left great toenail - the medial corner wants to be become ingrown and he has cut it back quite a bit.  It is not currently painful or red, no pus.  He has had this sort of problem off an on over the years with both feet and suspects it is due to a combination of the shape of his nails and his work boots Patient Active Problem List   Diagnosis Date Noted   . Social anxiety disorder 03/22/2016  . Muscle pain 03/22/2016  . Other seasonal allergic rhinitis 03/22/2016  . Tobacco abuse 02/07/2012  . ACNE VULGARIS 06/05/2010  . ADHD 04/13/2008    Past Medical History:  Diagnosis Date  . ADHD (attention deficit hyperactivity disorder)     No past surgical history on file.  Social History  Substance Use Topics  . Smoking status: Current Every Day Smoker    Packs/day: 0.50    Years: 4.00    Types: Cigarettes  . Smokeless tobacco: Not on file  . Alcohol use Yes    Family History  Problem Relation Age of Onset  . Lupus Mother     No Known Allergies  Medication list has been reviewed and updated.  Current Outpatient Prescriptions on File Prior to Visit  Medication Sig Dispense Refill  . ibuprofen (ADVIL,MOTRIN) 200 MG tablet Take 200 mg by mouth every 6 (six) hours as needed for pain (PAIN).    Marland Kitchen. propranolol (INDERAL) 10 MG tablet Take 1 tablet (10 mg total) by mouth 2 (two) times daily. Use as needed for social anxiety 30 tablet 1   No current facility-administered medications on file prior to visit.     Review of Systems:  As per HPI- otherwise negative.   Physical Examination: Vitals:  05/23/16 1458  BP: (!) 141/77  Pulse: 87  Temp: 98.3 F (36.8 C)   Vitals:   05/23/16 1458  Weight: 181 lb 9.6 oz (82.4 kg)  Height: 6\' 2"  (1.88 m)   Body mass index is 23.32 kg/m. Ideal Body Weight: Weight in (lb) to have BMI = 25: 194.3  GEN: WDWN, NAD, Non-toxic, A & O x 3, normal weight, looks well, some acne on jawline HEENT: Atraumatic, Normocephalic. Neck supple. No masses, No LAD. Ears and Nose: No external deformity. CV: RRR, No M/G/R. No JVD. No thrill. No extra heart sounds. PULM: CTA B, no wheezes, crackles, rhonchi. No retractions. No resp. distress. No accessory muscle use. Right wrist: at this time there is no visible abnl EXTR: No c/c/e NEURO Normal gait.  PSYCH: Normally interactive. Conversant. Not  depressed or anxious appearing.  Calm demeanor.  Left great toe: the medial edge has been cut back too short but is not currently ingrown, no current inflammation or infection is noted.    Assessment and Plan: GAD (generalized anxiety disorder) - Plan: busPIRone (BUSPAR) 15 MG tablet  Ingrown left big toenail  Ganglion cyst of wrist, left  Body aches  Decided to start on buspar for his anxiety. Would prefer to stay away from benzos in this young pt and he is not so sure about taking an SSRI marketed for depression.  He will let me know how this works for him Discussed likely ganglion cyst of wrist; the next time it is visible please let me know if he wants evaluation Discussed how to encourage the corner of his toenail to grow out properly and to prevent ingrowing Despite a fairly active job he does not get much formal exercise- encouraged this to help with body aches and anxiety    Signed Abbe AmsterdamJessica Nysa Sarin, MD

## 2016-05-23 NOTE — Progress Notes (Signed)
Pre visit review using our clinic review tool, if applicable. No additional management support is needed unless otherwise documented below in the visit note. 

## 2016-05-23 NOTE — Patient Instructions (Signed)
It was very nice to see you again today!  For anxiety, we will have you start on buspar.  Take 1/2 tablet twice daily for 5 days, then 1/2 am and 1 pm for 5 days, then go to a whole tablet twice a day (unless a lower dose is sufficient!)  Please let me know how this is working for you  Be sure to keep the inside corner of your toenail from growing into the skin; after a shower pull the nail out from the skin, dental floss or a small tuft of cotton can be helpful  I would suggest that you start yoga or another exercise program to help manage body aches and stiffness

## 2016-07-27 DIAGNOSIS — B349 Viral infection, unspecified: Secondary | ICD-10-CM | POA: Diagnosis not present

## 2016-07-27 DIAGNOSIS — J029 Acute pharyngitis, unspecified: Secondary | ICD-10-CM | POA: Diagnosis not present

## 2016-07-30 ENCOUNTER — Encounter: Payer: Self-pay | Admitting: Physician Assistant

## 2016-07-30 ENCOUNTER — Ambulatory Visit (INDEPENDENT_AMBULATORY_CARE_PROVIDER_SITE_OTHER): Payer: BLUE CROSS/BLUE SHIELD | Admitting: Physician Assistant

## 2016-07-30 VITALS — BP 132/80 | HR 100 | Temp 99.5°F | Resp 16 | Ht 74.0 in | Wt 181.0 lb

## 2016-07-30 DIAGNOSIS — R0981 Nasal congestion: Secondary | ICD-10-CM | POA: Diagnosis not present

## 2016-07-30 DIAGNOSIS — B2790 Infectious mononucleosis, unspecified without complication: Secondary | ICD-10-CM | POA: Diagnosis not present

## 2016-07-30 MED ORDER — FLUTICASONE PROPIONATE 50 MCG/ACT NA SUSP: 2.0000 | Freq: Every day | NASAL | 6 refills | Status: DC

## 2016-07-30 MED ORDER — METHYLPREDNISOLONE 4 MG PO TBPK: ORAL_TABLET | ORAL | 0 refills | Status: DC

## 2016-07-30 NOTE — Patient Instructions (Signed)
Please stay well hydrated and get plenty of rest. Take the steroid (Medrol pack) as directed. This will help with pain and swelling. Keep a humidifier running in the bedroom.  Saline nasal spray or Neti Pot to flush out nasal congestion. Use the Flonase as directed.  Once you have finished the steroid, you can start OTc Ibuprofen if needed for fever, aches or pain.   Follow-up with your primary care provider in 2 weeks. If symptoms are not improving with current regimen, please call me.   No heavy lifting or overexertion. Mono can take 4-6 weeks to calm completely down.   Infectious Mononucleosis Infectious mononucleosis is an infection that is caused by a virus. This illness is often called "mono." You can get mono from close contact with someone who is infected (it is contagious). If you have mono, you may feel tired and have a sore throat, a headache, or a fever. Mono is usually not serious, but some people may need to be treated for it in the hospital. Follow these instructions at home: Medicines  Take over-the-counter and prescription medicines only as told by your doctor.  Do not take ampicillin or amoxicillin. This may cause a rash.  If you are under 18, do not take aspirin. Activity  Rest as needed.  Do not do any of the following activities until your doctor says that they are safe for you:  Contact sports. You may need to wait a month or longer before you play sports.  Exercise that requires a lot of energy.  Lifting heavy things.  Slowly go back to your normal activities after your fever is gone, or when your doctor says that you can. Be sure to rest when you get tired. Preventing infectious mononucleosis  Avoid contact with people who have mono. An infected person may not seem sick, but he or she can still spread the virus.  Avoid sharing forks, spoons, knives (utensils), drinking cups, or toothbrushes.  Wash your hands often with soap and water. If you cannot  use soap and water, use hand sanitizer.  Use the inside of your elbow to cover your mouth when you cough or sneeze. General instructions  Avoid kissing or sharing forks, spoons, knives, or drinking cups until your doctor approves.  Drink enough fluid to keep your pee (urine) clear or pale yellow.  Do not drink alcohol.  If you have a sore throat:  Rinse your mouth (gargle) with a salt-water mixture 3-4 times a day or as needed. To make a salt-water mixture, completely dissolve -1 tsp of salt in 1 cup of warm water.  Eat soft foods. Cold foods such as ice cream or frozen ice pops can help your throat feel better.  Try sucking on hard candy.  Wash your hands often with soap and water. If you cannot use soap and water, use hand sanitizer. Contact a doctor if:  Your fever is not gone after 10 days.  You have swelling by your jaw or neck (swollen lymph nodes), and the swelling does not go away after 4 weeks.  Your activity level is not back to normal after 2 months.  Your skin or the white parts of your eyes turn yellow (jaundice).  You have trouble pooping (have constipation). This may mean that you:  Poop (have a bowel movement) fewer times in a week than normal.  Have a hard time pooping.  Have poop that is dry, hard, or bigger than normal. Get help right away if:  You  have very bad pain in your:  Belly (abdomen).  Shoulder.  You are drooling.  You have trouble swallowing.  You have trouble breathing.  You have a stiff neck.  You have a very bad headache.  You cannot stop throwing up (vomiting).  You have jerky movements that you cannot control (seizures).  You are confused.  You have trouble with balance.  Your nose or gums start to bleed.  You have signs of body fluid loss (dehydration). These may include:  Weakness.  Sunken eyes.  Pale skin.  Dry mouth.  Fast breathing or heartbeat. Summary  Infectious mononucleosis, or "mono," is an  infection that is caused by a virus.  Mono is usually not serious, but some people may need to be treated for it in the hospital.  You should not play contact sports or lift heavy things until your doctor says that you can.  Wash your hands often with soap and water. If you cannot use soap and water, use hand sanitizer. This information is not intended to replace advice given to you by your health care provider. Make sure you discuss any questions you have with your health care provider. Document Released: 06/06/2009 Document Revised: 03/06/2016 Document Reviewed: 03/06/2016 Elsevier Interactive Patient Education  2017 [employer].

## 2016-07-30 NOTE — Progress Notes (Signed)
Patient presents to clinic today for continued sore throat, swollen glands, fever and fatigue that have been present over the past week. Was seen at [facility] last week, tested positive for mononucleosis. Was given viscous lidocaine to help numb throat. Was not given other instructions per patient. Endorses some nasal congestion at night. Denies other new symptoms. Cannot take tylenol due to allergies. Has taken ibuprofen with some relief in symptoms.   Past Medical History:  Diagnosis Date  . ADHD (attention deficit hyperactivity disorder)     Current Outpatient Prescriptions on File Prior to Visit  Medication Sig Dispense Refill  . ibuprofen (ADVIL,MOTRIN) 200 MG tablet Take 200 mg by mouth every 6 (six) hours as needed for pain (PAIN).     No current facility-administered medications on file prior to visit.     Allergies  Allergen Reactions  . Tylenol [Acetaminophen] Hives and Shortness Of Breath    Family History  Problem Relation Age of Onset  . Lupus Mother     Social History   Social History  . Marital status: Single    Spouse name: N/A  . Number of children: N/A  . Years of education: N/A   Social History Main Topics  . Smoking status: Current Every Day Smoker    Packs/day: 0.50    Years: 4.00    Types: Cigarettes  . Smokeless tobacco: Never Used  . Alcohol use Yes  . Drug use: No  . Sexual activity: Yes    Partners: Female   Other Topics Concern  . None   Social History Narrative   [employer], Primary school teachergraphic design.  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  Mother is in town, father is deceased of ?OD.   Review of Systems - See HPI.  All other ROS are negative.  BP 132/80   Pulse 100   Temp 99.5 F (37.5 C) (Oral)   Resp 16   Ht 6\' 2"  (1.88 m)   Wt 181 lb (82.1 kg)   SpO2 97%   BMI 23.24 kg/m   Physical Exam  Constitutional: He is oriented to person, place, and time and well-developed, well-nourished, and in no distress.  HENT:  Head: Normocephalic and atraumatic.  Right Ear:  Tympanic membrane normal.  Left Ear: Tympanic membrane normal.  Nose: Nose normal.  Mouth/Throat: Uvula is midline and mucous membranes are normal. Posterior oropharyngeal erythema present. No posterior oropharyngeal edema or tonsillar abscesses.  Cardiovascular: Normal rate, regular rhythm, normal heart sounds and intact distal pulses.   Pulmonary/Chest: Effort normal and breath sounds normal. No respiratory distress. He has no wheezes. He has no rales. He exhibits no tenderness.  Lymphadenopathy:       Head (right side): Submandibular and tonsillar adenopathy present.       Head (left side): Submandibular and tonsillar adenopathy present.    He has cervical adenopathy.       Right cervical: Posterior cervical adenopathy present.       Left cervical: Posterior cervical adenopathy present.  Neurological: He is alert and oriented to person, place, and time.  Skin: Skin is warm. No rash noted.  Vitals reviewed.  Assessment/Plan: 1. Infectious mononucleosis without complication, infectious mononucleosis due to unspecified organism [facility] No splenomegaly palpable on examination. Will start Medrol pack to help with swelling/inflammation. Supportive measures and OTC medications reviewed. FU if not improving gradually or new symptoms develop. No physical exertion at present.  - methylPREDNISolone (MEDROL DOSEPAK) 4 MG TBPK tablet; Take following package directions.  Dispense: 21 tablet; Refill:  0  2. Nasal congestion Rx Flonase - fluticasone (FLONASE) 50 MCG/ACT nasal spray; Place 2 sprays into both nostrils daily.  Dispense: 16 g; Refill: 6   Piedad Climes, [place]

## 2016-07-30 NOTE — Progress Notes (Signed)
Pre visit review using our clinic review tool, if applicable. No additional management support is needed unless otherwise documented below in the visit note. 

## 2016-08-01 ENCOUNTER — Ambulatory Visit: Payer: Self-pay | Admitting: Family Medicine

## 2016-08-07 ENCOUNTER — Encounter: Payer: Self-pay | Admitting: Physician Assistant

## 2017-02-03 NOTE — Progress Notes (Signed)
[facility] [place] [address] [place], [place] [address] [phone]  Date:  02/04/2017   Name:  Bob Hodges Kloc   DOB:  08/09/1990   MRN:  [id number]  PCP:  Pearline Cablesopland, Kiril Hippe C, MD    Chief Complaint: Follow-up (Pt here to f/u on anxiety. )   History of Present Illness:  Bob Hodges Elkhatib is a 26 y.o. very pleasant male patient who presents with the following:  Here today for a follow-up visit History of social anxiety, ADHD  Last here in November:  Decided to start on buspar for his anxiety. Would prefer to stay away from benzos in this young pt and he is not so sure about taking an SSRI marketed for depression.  He will let me know how this works for him Discussed likely ganglion cyst of wrist; the next time it is visible please let me know if he wants evaluation Discussed how to encourage the corner of his toenail to grow out properly and to prevent ingrowing Despite a fairly active job he does not get much formal exercise- encouraged this to help with body aches and anxiety   Here today for a recheck visit He did not continue taking his medications- buspar or propranolol- as he did not feel that they helped He got mono in January - he is now better  He states that he feels ok at work as far as anxiety except when he has to do things in a group-He works at [employer] and does fine in his normal job duties.  However when they have group activities (like the once a month group lunch) he will feel very nervous, shaky and sweaty  However he notes that he seems to be muscle cramps in various muscles of his body.  Does not seem to follow any pattern He first noted some muscle aches in his back a few years ago  We did some labs to try and determine the cause of this in the past and all was negative  The cramps will will last a variable amount of time, and can leave him sore for the next day or so His mother does have lupus   He  is not taking any ADHD medication for the last several years.  He recently got a promotion at work- he is using some of the heavy equipment at work and has more responsibility. He feels that his focus is a problem and he would like to go back on ADD meds  He was on adderall in the past- he last took this when he was 89, then he lost his insurance and had to stop using it.  Most recently he was taking 30 mg BID per his recollection He reports seeing a psychologist years ago- at 26 yo- and was dx with ADD and also with his short term memory problem  NCCSR: no entries   Pulse Readings from Last 3 Encounters:  02/04/17 90  07/30/16 100  05/23/16 87   He also notes a long history of cystic acne- he was on doxycycline years ago and it did seem to help He will get break-outs on his face, neck, chest and back which can be painful   Patient Active Problem List   Diagnosis Date Noted  . Social anxiety disorder 03/22/2016  . Muscle pain 03/22/2016  . Other seasonal allergic rhinitis 03/22/2016  . Tobacco abuse 02/07/2012  . ACNE VULGARIS 06/05/2010  . ADHD 04/13/2008  Past Medical History:  Diagnosis Date  . ADHD (attention deficit hyperactivity disorder)     No past surgical history on file.  Social History  Substance Use Topics  . Smoking status: Current Every Day Smoker    Packs/day: 0.50    Years: 4.00    Types: Cigarettes  . Smokeless tobacco: Never Used  . Alcohol use Yes    Family History  Problem Relation Age of Onset  . Lupus Mother     Allergies  Allergen Reactions  . Tylenol [Acetaminophen] Hives and Shortness Of Breath    Medication list has been reviewed and updated.  Current Outpatient Prescriptions on File Prior to Visit  Medication Sig Dispense Refill  . ibuprofen (ADVIL,MOTRIN) 200 MG tablet Take 200 mg by mouth every 6 (six) hours as needed for pain (PAIN).     No current facility-administered medications on file prior to visit.     Review of  Systems:  As per HPI- otherwise negative.   Physical Examination: Vitals:   02/04/17 1245 02/04/17 1316  BP: (!) 166/80 122/78  Pulse: (!) 113 90  Temp: 98.2 F (36.8 C)    Vitals:   02/04/17 1245  Weight: 189 lb 6.4 oz (85.9 kg)  Height: 6\' 2"  (1.88 m)   Body mass index is 24.32 kg/m. Ideal Body Weight: Weight in (lb) to have BMI = 25: 194.3  GEN: WDWN, NAD, Non-toxic, A & O x 3, normal weight, tobacco odor HEENT: Atraumatic, Normocephalic. Neck supple. No masses, No LAD.  Bilateral TM wnl, oropharynx normal.  PEERL,EOMI.  He does have cystic and pustular acne over his face, chest, and neck Ears and Nose: No external deformity. CV: RRR, No M/G/R. No JVD. No thrill. No extra heart sounds. PULM: CTA B, no wheezes, crackles, rhonchi. No retractions. No resp. distress. No accessory muscle use. EXTR: No c/c/e NEURO Normal gait.  PSYCH: Normally interactive. Conversant. Not depressed or anxious appearing.  Calm demeanor.   Repeat BP and pulse normal Assessment and Plan: Social anxiety disorder - Plan: LORazepam (ATIVAN) 1 MG tablet  Cystic acne - Plan: doxycycline (PERIOSTAT) 20 MG tablet, Ambulatory referral to Dermatology  ADHD (attention deficit hyperactivity disorder), inattentive type - Plan: amphetamine-dextroamphetamine (ADDERALL) 10 MG tablet  Here today with a few concerns Referral to derm for his acne- ? accutane might be appropriate for him. For now will start on doxy 20 BID Cautioned about sun sensitivity Will start on adderall 5 mg BID- he will let me know how this is working for him and we can adjust dose as needed He will also need a UDS and contract if he decides to keep taking this Ativan to use prior to social occasions only  cautioned regarding sedation and dependence He declines to look into his cramps further with neurology referral at this time Signed Abbe AmsterdamJessica Zarina Pe, MD

## 2017-02-04 ENCOUNTER — Ambulatory Visit (INDEPENDENT_AMBULATORY_CARE_PROVIDER_SITE_OTHER): Payer: BLUE CROSS/BLUE SHIELD | Admitting: Family Medicine

## 2017-02-04 VITALS — BP 122/78 | HR 90 | Temp 98.2°F | Ht 74.0 in | Wt 189.4 lb

## 2017-02-04 DIAGNOSIS — L700 Acne vulgaris: Secondary | ICD-10-CM

## 2017-02-04 DIAGNOSIS — F4010 Social phobia, unspecified: Secondary | ICD-10-CM

## 2017-02-04 DIAGNOSIS — F900 Attention-deficit hyperactivity disorder, predominantly inattentive type: Secondary | ICD-10-CM | POA: Diagnosis not present

## 2017-02-04 MED ORDER — AMPHETAMINE-DEXTROAMPHETAMINE 10 MG PO TABS: ORAL_TABLET | ORAL | 0 refills | Status: DC

## 2017-02-04 MED ORDER — DOXYCYCLINE HYCLATE 20 MG PO TABS: 20.0000 mg | ORAL_TABLET | Freq: Two times a day (BID) | ORAL | 6 refills | Status: DC

## 2017-02-04 MED ORDER — LORAZEPAM 1 MG PO TABS: ORAL_TABLET | ORAL | 0 refills | Status: DC

## 2017-02-04 NOTE — Patient Instructions (Signed)
It was good to see you today!   We will refer you to dermatology, and I have rx doxycyline (low dose) to try and help with your acne. Remember this can make you more sun sensitive!  Subantimicrobial dose doxycycline - Concern over the induction of antibiotic resistance with long-term antibiotic use for acne has led to the investigation of subantimicrobial doses of doxycycline for the treatment of acne vulgaris. Subantimicrobial doses of antibiotics are doses for which the anti-inflammatory properties are maintained, but antibacterial action is absent. In this manner, the potential for inducing antibacterial resistance is diminished. The tetracyclines, particularly doxycycline, are often administered at subantimicrobial doses for treating acne. This lower dosage may block inflammation in acne through multiple mechanisms, including the inhibition of matrix metalloproteinases, regulation of inflammatory cytokines, inhibition of leukocyte chemotaxis and activation, and antioxidation [161].    Try a 1/2 ativan- may increase to a whole tablet as needed- for social anxiety Remember this can make you feel sleepy!   We will start you back on adderall- will gradually increase your dose to determine how much you now need.    This med must be prescribed on paper- I can't call or email it in to your pharmacy.

## 2017-03-06 ENCOUNTER — Other Ambulatory Visit: Payer: Self-pay | Admitting: Family Medicine

## 2017-03-06 DIAGNOSIS — F4010 Social phobia, unspecified: Secondary | ICD-10-CM

## 2017-03-06 DIAGNOSIS — F900 Attention-deficit hyperactivity disorder, predominantly inattentive type: Secondary | ICD-10-CM

## 2017-03-06 NOTE — Telephone Encounter (Signed)
Pt called in to make provider aware that medication is working well    Pt would like a refill on :   Adderall and LORazepam    CB: [phone]   ALSO, pt would like to go ahead and get his UDS completed.    Please advise.

## 2017-03-07 ENCOUNTER — Encounter: Payer: Self-pay | Admitting: Family Medicine

## 2017-03-07 DIAGNOSIS — Z79899 Other long term (current) drug therapy: Secondary | ICD-10-CM | POA: Diagnosis not present

## 2017-03-07 MED ORDER — LORAZEPAM 1 MG PO TABS: ORAL_TABLET | ORAL | 2 refills | Status: DC

## 2017-03-07 MED ORDER — AMPHETAMINE-DEXTROAMPHETAMINE 10 MG PO TABS: 10.0000 mg | ORAL_TABLET | Freq: Two times a day (BID) | ORAL | 0 refills | Status: DC

## 2017-03-07 MED ORDER — AMPHETAMINE-DEXTROAMPHETAMINE 10 MG PO TABS: ORAL_TABLET | ORAL | 0 refills | Status: DC

## 2017-03-07 NOTE — Telephone Encounter (Signed)
NCCSR: he filled lorazepam on 8/6 and adderall on 8/6 Ok to refill today Will call pt and let him know that he can pick up rx and also do his UDS at his convenience

## 2017-04-17 NOTE — Progress Notes (Addendum)
[facility] [address], [address] [place], [place] [address] [phone]  Date:  04/18/2017   Name:  Bob Hodges   DOB:  06/13/1991   MRN:  [id number]  PCP:  Pearline Cablesopland, Anjelica Gorniak C, MD    Chief Complaint: Dysuria (c/o occ tingling during urination. )   History of Present Illness:  Bob Carpenaylor Wenzler is a 26 y.o. very pleasant male patient who presents with the following:  Generally healthy young man here today with concern of STI screening He rarely may notice a little tingling with urination- however he has not really been bothered by sx.   He had a couple of encounters with new partners and wants to make sure all is well We did screening for him about a year ago He is maintained on medication for anxiety and ADHD He notes that his sx are under pretty good control right now and he is feeling well overall  He just drank an energy drink which may have elevated his BP and pulse- however he does not have any CP or SOB- he feels fine   He feels like his anxiety is under good control, and his ADHD symptoms are adequately managed to allow him to do his job  BP Readings from Last 3 Encounters:  04/18/17 (!) 148/96  02/04/17 122/78  07/30/16 132/80     Patient Active Problem List   Diagnosis Date Noted  . Social anxiety disorder 03/22/2016  . Muscle pain 03/22/2016  . Other seasonal allergic rhinitis 03/22/2016  . Tobacco abuse 02/07/2012  . ACNE VULGARIS 06/05/2010  . ADHD 04/13/2008    Past Medical History:  Diagnosis Date  . ADHD (attention deficit hyperactivity disorder)     No past surgical history on file.  Social History  Substance Use Topics  . Smoking status: Current Every Day Smoker    Packs/day: 0.50    Years: 4.00    Types: Cigarettes  . Smokeless tobacco: Never Used  . Alcohol use Yes    Family History  Problem Relation Age of Onset  . Lupus Mother     Allergies  Allergen Reactions  . Tylenol  [Acetaminophen] Hives and Shortness Of Breath    Medication list has been reviewed and updated.  Current Outpatient Prescriptions on File Prior to Visit  Medication Sig Dispense Refill  . amphetamine-dextroamphetamine (ADDERALL) 10 MG tablet Take 1 tablet twice a day 60 tablet 0  . amphetamine-dextroamphetamine (ADDERALL) 10 MG tablet Take 1 tablet (10 mg total) by mouth 2 (two) times daily. 60 tablet 0  . doxycycline (PERIOSTAT) 20 MG tablet Take 1 tablet (20 mg total) by mouth 2 (two) times daily. 60 tablet 6  . ibuprofen (ADVIL,MOTRIN) 200 MG tablet Take 200 mg by mouth every 6 (six) hours as needed for pain (PAIN).    . LORazepam (ATIVAN) 1 MG tablet Use 1/2 or 1 prior to social occasion as needed for anxiety 20 tablet 2   No current facility-administered medications on file prior to visit.     Review of Systems:  As per HPI- otherwise negative. No fever or chills No CP or SOB No rash No penile discharge No nausea, vomiting or diarrhea    Physical Examination: Vitals:   04/18/17 1709 04/18/17 1713  BP: (!) 160/87 (!) 148/96  Pulse: (!) 129 (!) 111  Temp: 98.5 F (36.9 C)   SpO2: 100%    Vitals:   04/18/17 1709  Weight: 179 lb (81.2  kg)  Height: 6\' 2"  (1.88 m)   Body mass index is 22.98 kg/m. Ideal Body Weight: Weight in (lb) to have BMI = 25: 194.3  GEN: WDWN, NAD, Non-toxic, A & O x 3, looks well HEENT: Atraumatic, Normocephalic. Neck supple. No masses, No LAD. Ears and Nose: No external deformity. CV: RRR, No M/G/R. No JVD. No thrill. No extra heart sounds. PULM: CTA B, no wheezes, crackles, rhonchi. No retractions. No resp. distress. No accessory muscle use. EXTR: No c/c/e NEURO Normal gait.  PSYCH: Normally interactive. Conversant. Not depressed or anxious appearing.  Calm demeanor.    Assessment and Plan: Routine screening for STI (sexually transmitted infection) - Plan: HIV antibody, Hepatitis C antibody, Hepatitis B surface antigen, RPR, CANCELED:  GC/Chlamydia Probe Amp  ADHD (attention deficit hyperactivity disorder), inattentive type - Plan: amphetamine-dextroamphetamine (ADDERALL) 10 MG tablet  STI screening today- Will plan further follow- up pending labs.  Gave one month of adderall today- this way we can plan to meet for full refills in 2 months Will plan further follow- up pending labs. Advised him to cut down on caffeine   Signed Abbe Amsterdam, MD  Received labs 10/22- called and let him know all ok. Will mail him a copy as well Results for orders placed or performed in visit on 04/18/17  C. trachomatis/N. gonorrhoeae RNA  Result Value Ref Range   C. trachomatis RNA, TMA NOT DETECTED NOT DETECT   N. gonorrhoeae RNA, TMA NOT DETECTED NOT DETECT  HIV antibody  Result Value Ref Range   HIV 1&2 Ab, 4th Generation NON-REACTIVE NON-REACTI  Hepatitis C antibody  Result Value Ref Range   Hepatitis C Ab NON-REACTIVE NON-REACTI   SIGNAL TO CUT-OFF 0.01 <1.00  Hepatitis B surface antigen  Result Value Ref Range   Hepatitis B Surface Ag NON-REACTIVE NON-REACTI  RPR  Result Value Ref Range   RPR Ser Ql NON-REACTIVE NON-REACTI

## 2017-04-18 ENCOUNTER — Ambulatory Visit (INDEPENDENT_AMBULATORY_CARE_PROVIDER_SITE_OTHER): Payer: BLUE CROSS/BLUE SHIELD | Admitting: Family Medicine

## 2017-04-18 VITALS — BP 140/90 | HR 96 | Temp 98.5°F | Ht 74.0 in | Wt 179.0 lb

## 2017-04-18 DIAGNOSIS — F900 Attention-deficit hyperactivity disorder, predominantly inattentive type: Secondary | ICD-10-CM

## 2017-04-18 DIAGNOSIS — Z113 Encounter for screening for infections with a predominantly sexual mode of transmission: Secondary | ICD-10-CM

## 2017-04-18 MED ORDER — AMPHETAMINE-DEXTROAMPHETAMINE 10 MG PO TABS: ORAL_TABLET | ORAL | 0 refills | Status: DC

## 2017-04-18 NOTE — Patient Instructions (Signed)
It was good to see you today- I will be in touch with your labs asap!  ?

## 2017-04-19 DIAGNOSIS — Z113 Encounter for screening for infections with a predominantly sexual mode of transmission: Secondary | ICD-10-CM | POA: Diagnosis not present

## 2017-04-19 NOTE — Addendum Note (Signed)
Addended by: Harley AltoPRICE, Kamylle Axelson M on: 04/19/2017 07:34 AM   Modules accepted: Orders

## 2017-04-20 LAB — C. TRACHOMATIS/N. GONORRHOEAE RNA
C. trachomatis RNA, TMA: NOT DETECTED
N. GONORRHOEAE RNA, TMA: NOT DETECTED

## 2017-04-22 ENCOUNTER — Encounter: Payer: Self-pay | Admitting: Family Medicine

## 2017-04-22 LAB — HEPATITIS B SURFACE ANTIGEN: Hepatitis B Surface Ag: NONREACTIVE

## 2017-04-22 LAB — RPR: RPR Ser Ql: NONREACTIVE

## 2017-04-22 LAB — HEPATITIS C ANTIBODY
HEP C AB: NONREACTIVE
SIGNAL TO CUT-OFF: 0.01 (ref <1.00)

## 2017-04-22 LAB — HIV ANTIBODY (ROUTINE TESTING W REFLEX): HIV 1&2 Ab, 4th Generation: NONREACTIVE

## 2017-05-29 NOTE — Progress Notes (Signed)
[facility] [address] [place], [place] [address] [phone]  Date:  05/30/2017   Name:  Bob Hodges   DOB:  07/10/1990   MRN:  [id number]  PCP:  Pearline Cablesopland, Peng Thorstenson C, MD    Chief Complaint: Medication Refill (Pt here for medicatin refill)   History of Present Illness:  Bob Hodges is a 26 y.o. very pleasant male patient who presents with the following:  History of ADHD, anxiety UDS and contract signed 9/19- looked ok NCCSR: filled his lorazepam and adderall on 10/31 Nothing unexpected noted   He notes that adderall did great for the first 3 months- however this month he feels like he is not focusing as well as he did in the past He is using ativan prior to going out for a social engagement.   He did try adderall xr in the past- several year ago. However it seemed to wear off too soon during the day so he did not prefer it He does not notice any difficulty sleeping or eating.  No CP or SOB  He had an abscess in his gluteal cleft that ruptured at work today- it was hurting for the last 3 days or so, now feels better.  It definitely drained some pus earlier today  He is unsure about his last tetanus- but would like to get today declines flu shot today  He is taking a low dose of doxycycline for his skin- just 20 mg daily   He did not take adderall today at all per his report - noted that his BP and pulse are high, he admits to feeling anxious about MD viist   BP Readings from Last 3 Encounters:  05/30/17 (!) 148/70  04/18/17 140/90  02/04/17 122/78   Pulse Readings from Last 3 Encounters:  05/30/17 (!) 110  04/18/17 96  02/04/17 90    Patient Active Problem List   Diagnosis Date Noted  . Social anxiety disorder 03/22/2016  . Muscle pain 03/22/2016  . Other seasonal allergic rhinitis 03/22/2016  . Tobacco abuse 02/07/2012  . ACNE VULGARIS 06/05/2010  . ADHD 04/13/2008    Past Medical  History:  Diagnosis Date  . ADHD (attention deficit hyperactivity disorder)     No past surgical history on file.  Social History   Tobacco Use  . Smoking status: Current Every Day Smoker    Packs/day: 0.50    Years: 4.00    Pack years: 2.00    Types: Cigarettes  . Smokeless tobacco: Never Used  Substance Use Topics  . Alcohol use: Yes  . Drug use: No    Family History  Problem Relation Age of Onset  . Lupus Mother     Allergies  Allergen Reactions  . Tylenol [Acetaminophen] Hives and Shortness Of Breath    Medication list has been reviewed and updated.  Current Outpatient Medications on File Prior to Visit  Medication Sig Dispense Refill  . amphetamine-dextroamphetamine (ADDERALL) 10 MG tablet Take 1 tablet (10 mg total) by mouth 2 (two) times daily. 60 tablet 0  . amphetamine-dextroamphetamine (ADDERALL) 10 MG tablet Take 1 tablet twice a day 60 tablet 0  . doxycycline (PERIOSTAT) 20 MG tablet Take 1 tablet (20 mg total) by mouth 2 (two) times daily. 60 tablet 6  . ibuprofen (ADVIL,MOTRIN) 200 MG tablet Take 200 mg by mouth every 6 (six) hours as needed for pain (PAIN).    . LORazepam (ATIVAN) 1 MG  tablet Use 1/2 or 1 prior to social occasion as needed for anxiety 20 tablet 2   No current facility-administered medications on file prior to visit.     Review of Systems:  As per HPI- otherwise negative. No fever or chills No N,V,D No flu like sx   Physical Examination: Vitals:   05/30/17 1624  BP: (!) 148/70  Pulse: (!) 110  Temp: 98.6 F (37 C)  SpO2: 98%   Vitals:   05/30/17 1624  Weight: 179 lb 6.4 oz (81.4 kg)  Height: 6\' 2"  (1.88 m)   Body mass index is 23.03 kg/m. Ideal Body Weight: Weight in (lb) to have BMI = 25: 194.3  GEN: WDWN, NAD, Non-toxic, A & O x 3, looks well, slim build HEENT: Atraumatic, Normocephalic. Neck supple. No masses, No LAD. Ears and Nose: No external deformity. CV: RRR, No M/G/R. No JVD. No thrill. No extra heart  sounds. PULM: CTA B, no wheezes, crackles, rhonchi. No retractions. No resp. distress. No accessory muscle use. ABD: S, NT, ND, +BS. No rebound. No HSM. EXTR: No c/c/e NEURO Normal gait.  PSYCH: Normally interactive. Conversant. Not depressed or anxious appearing.  Calm demeanor.  Right buttock in gluteal cleft- he does have an already drained abscess that does not seems to need further I and D today    Assessment and Plan: Social anxiety disorder - Plan: LORazepam (ATIVAN) 1 MG tablet  ADHD (attention deficit hyperactivity disorder), inattentive type - Plan: amphetamine-dextroamphetamine (ADDERALL) 10 MG tablet, amphetamine-dextroamphetamine (ADDERALL) 10 MG tablet, amphetamine-dextroamphetamine (ADDERALL) 10 MG tablet  Gluteal abscess - Plan: doxycycline (VIBRAMYCIN) 100 MG capsule  Immunization due - Plan: Tdap vaccine greater than or equal to 7yo IM  Increased to doxy 100 BID for 10 days for his abscess and discussed warm compresses, he will watch for any sign of worsening infection or non healing rx for adderall 10- decided to allow him up to 30 mg a day Refilled his lorazepam He has a CPE planned with me in February of next year which will allow us to follow-up on his BP and pulse   Signed Abbe AmsterdamJessica Shailee Foots, MD  Meds ordered this encounter  Medications  . amphetamine-dextroamphetamine (ADDERALL) 10 MG tablet    Sig: Take 1 tablet (10 mg total) by mouth 3 (three) times daily as needed.    Dispense:  90 tablet    Refill:  0    Ok to fill in 30 days  . amphetamine-dextroamphetamine (ADDERALL) 10 MG tablet    Sig: Take 1 tablet TID prn    Dispense:  90 tablet    Refill:  0  . amphetamine-dextroamphetamine (ADDERALL) 10 MG tablet    Sig: Take 1 tablet (10 mg total) by mouth 3 (three) times daily as needed. Ok to fill in 60 days    Dispense:  90 tablet    Refill:  0  . doxycycline (VIBRAMYCIN) 100 MG capsule    Sig: Take 1 capsule (100 mg total) by mouth 2 (two) times daily.     Dispense:  20 capsule    Refill:  0  . LORazepam (ATIVAN) 1 MG tablet    Sig: Use 1/2 or 1 prior to social occasion as needed for anxiety    Dispense:  20 tablet    Refill:  2

## 2017-05-30 ENCOUNTER — Encounter: Payer: Self-pay | Admitting: Family Medicine

## 2017-05-30 ENCOUNTER — Ambulatory Visit: Payer: BLUE CROSS/BLUE SHIELD | Admitting: Family Medicine

## 2017-05-30 VITALS — BP 132/70 | HR 90 | Temp 98.6°F | Ht 74.0 in | Wt 179.4 lb

## 2017-05-30 DIAGNOSIS — F900 Attention-deficit hyperactivity disorder, predominantly inattentive type: Secondary | ICD-10-CM

## 2017-05-30 DIAGNOSIS — Z23 Encounter for immunization: Secondary | ICD-10-CM

## 2017-05-30 DIAGNOSIS — F4010 Social phobia, unspecified: Secondary | ICD-10-CM

## 2017-05-30 DIAGNOSIS — L0231 Cutaneous abscess of buttock: Secondary | ICD-10-CM

## 2017-05-30 MED ORDER — LORAZEPAM 1 MG PO TABS: ORAL_TABLET | ORAL | 2 refills | Status: DC

## 2017-05-30 MED ORDER — AMPHETAMINE-DEXTROAMPHETAMINE 10 MG PO TABS: 10.0000 mg | ORAL_TABLET | Freq: Three times a day (TID) | ORAL | 0 refills | Status: DC | PRN

## 2017-05-30 MED ORDER — AMPHETAMINE-DEXTROAMPHETAMINE 10 MG PO TABS: ORAL_TABLET | ORAL | 0 refills | Status: DC

## 2017-05-30 MED ORDER — DOXYCYCLINE HYCLATE 100 MG PO CAPS: 100.0000 mg | ORAL_CAPSULE | Freq: Two times a day (BID) | ORAL | 0 refills | Status: DC

## 2017-05-30 NOTE — Patient Instructions (Addendum)
Good to see you today!  We gave you a higher dose of doxycycline to take twice daily for 10 days for abscess. Continue warm compresses/ warm soaks  If this is not getting better over the next few days please let me know I increased your adderall to 10 mg, up to 3 (or 30mg ) daily.  You can distribute this during the day as you see fit; however avoid taking this too late in the evening as it may keep you from sleeping   I also refilled your lorazepam

## 2017-08-07 NOTE — Progress Notes (Signed)
[facility] [address], [address] [place], [place] [address] [phone]  Date:  08/08/2017   Name:  Bob Hodges   DOB:  08-29-1990   MRN:  [id number]  PCP:  Pearline Cables, MD    Chief Complaint: Annual Exam (Pt here for CPE and med refill )   History of Present Illness:  Bob Hodges is a 27 y.o. very pleasant male patient who presents with the following:  History of anxiety, ADHD Here today for a CPE Last labs 9/17 except for STI screening last fall  He is treated with adderall for his ADHD, and prn ativan for anxiety NCCSR reviewed 2/6: he is filling lorazepam and adderall about every 30 days from myself only, nothing of concern.  Did get refills on 1/29 Contract and UDS on 9/18 He is eating ok He has a new girlfriend which is going well Anxiety is under control  He last ate this afternoon- we will check labs and STI  tdap is UTD Flu: he declines a flu shot today  Discussed gardasil with him- he wants to think about this. I gave him some material to read about it  He continues to suffer from cystic acne, but this seems to be somewhat better than in the past which is good He did not send up seeing dermatology about accutane but may still end up doing this   BP Readings from Last 3 Encounters:  08/08/17 (!) 144/72  05/30/17 132/70  04/18/17 140/90     Patient Active Problem List   Diagnosis Date Noted  . Social anxiety disorder 03/22/2016  . Muscle pain 03/22/2016  . Other seasonal allergic rhinitis 03/22/2016  . Tobacco abuse 02/07/2012  . ACNE VULGARIS 06/05/2010  . ADHD 04/13/2008    Past Medical History:  Diagnosis Date  . ADHD (attention deficit hyperactivity disorder)     No past surgical history on file.  Social History   Tobacco Use  . Smoking status: Current Every Day Smoker    Packs/day: 0.50    Years: 4.00    Pack years: 2.00    Types: Cigarettes  . Smokeless tobacco: Never  Used  Substance Use Topics  . Alcohol use: Yes  . Drug use: No    Family History  Problem Relation Age of Onset  . Lupus Mother     Allergies  Allergen Reactions  . Tylenol [Acetaminophen] Hives and Shortness Of Breath    Medication list has been reviewed and updated.  Current Outpatient Medications on File Prior to Visit  Medication Sig Dispense Refill  . amphetamine-dextroamphetamine (ADDERALL) 10 MG tablet Take 1 tablet (10 mg total) by mouth 3 (three) times daily as needed. 90 tablet 0  . amphetamine-dextroamphetamine (ADDERALL) 10 MG tablet Take 1 tablet TID prn 90 tablet 0  . amphetamine-dextroamphetamine (ADDERALL) 10 MG tablet Take 1 tablet (10 mg total) by mouth 3 (three) times daily as needed. Ok to fill in 60 days 90 tablet 0  . doxycycline (PERIOSTAT) 20 MG tablet Take 1 tablet (20 mg total) by mouth 2 (two) times daily. 60 tablet 6  . doxycycline (VIBRAMYCIN) 100 MG capsule Take 1 capsule (100 mg total) by mouth 2 (two) times daily. 20 capsule 0  . ibuprofen (ADVIL,MOTRIN) 200 MG tablet Take 200 mg by mouth every 6 (six) hours as needed for pain (PAIN).    . LORazepam (ATIVAN) 1 MG tablet Use 1/2 or 1 prior to social occasion  as needed for anxiety 20 tablet 2   No current facility-administered medications on file prior to visit.     Review of Systems:  As per HPI- otherwise negative. No fever or chills He follows his pulse on his fit bit- notes that when he is not here at the office he runs about 88 bpm resting    Physical Examination: Vitals:   08/08/17 1635  BP: (!) 144/72  Pulse: (!) 125  Temp: 98.4 F (36.9 C)  SpO2: 99%   Vitals:   08/08/17 1635  Weight: 179 lb (81.2 kg)  Height: 6\' 2"  (1.88 m)   Body mass index is 22.98 kg/m. Ideal Body Weight: Weight in (lb) to have BMI = 25: 194.3  GEN: WDWN, NAD, Non-toxic, A & O x 3, looks well, slim build HEENT: Atraumatic, Normocephalic. Neck supple. No masses, No LAD.  Bilateral TM wnl, oropharynx  normal.  PEERL,EOMI.   Ears and Nose: No external deformity. CV: RRR, No M/G/R. No JVD. No thrill. No extra heart sounds. PULM: CTA B, no wheezes, crackles, rhonchi. No retractions. No resp. distress. No accessory muscle use. ABD: S, NT, ND, +BS. No rebound. No HSM. EXTR: No c/c/e NEURO Normal gait.  PSYCH: Normally interactive. Conversant. Not depressed or anxious appearing.  Calm demeanor.    Assessment and Plan: Routine screening for STI (sexually transmitted infection) - Plan: C. trachomatis/N. gonorrhoeae RNA, HIV antibody, RPR  Screening for deficiency anemia - Plan: CBC  Screening for hyperlipidemia - Plan: Lipid panel  Screening for diabetes mellitus - Plan: Comprehensive metabolic panel, Hemoglobin A1c  Physical exam  Here today for a CPE Labs pending as above Offered flu, gardasil vaccines today Discussed his acne- he may decide to see dermatology   Signed Abbe AmsterdamJessica Lake Cinquemani, MD

## 2017-08-08 ENCOUNTER — Ambulatory Visit (INDEPENDENT_AMBULATORY_CARE_PROVIDER_SITE_OTHER): Payer: BLUE CROSS/BLUE SHIELD | Admitting: Family Medicine

## 2017-08-08 ENCOUNTER — Encounter: Payer: Self-pay | Admitting: Family Medicine

## 2017-08-08 VITALS — BP 144/72 | HR 125 | Temp 98.4°F | Ht 74.0 in | Wt 179.0 lb

## 2017-08-08 DIAGNOSIS — Z113 Encounter for screening for infections with a predominantly sexual mode of transmission: Secondary | ICD-10-CM | POA: Diagnosis not present

## 2017-08-08 DIAGNOSIS — Z130 Encounter for screening for diseases of the blood and blood-forming organs and certain disorders involving the immune mechanism: Secondary | ICD-10-CM | POA: Diagnosis not present

## 2017-08-08 DIAGNOSIS — Z0000 Encounter for general adult medical examination without abnormal findings: Secondary | ICD-10-CM | POA: Diagnosis not present

## 2017-08-08 DIAGNOSIS — Z131 Encounter for screening for diabetes mellitus: Secondary | ICD-10-CM | POA: Diagnosis not present

## 2017-08-08 DIAGNOSIS — Z13220 Encounter for screening for lipoid disorders: Secondary | ICD-10-CM

## 2017-08-08 NOTE — Patient Instructions (Addendum)
Good to see you today!  I will be in touch with your labs asap We can now refill your medications online- finally!  Send me a message on mychart when you need refills of your adderall/ ativan I would encourage you to get the Gardasil vaccine series at your convenience    Health Maintenance, Male A healthy lifestyle and preventive care is important for your health and wellness. Ask your health care provider about what schedule of regular examinations is right for you. What should I know about weight and diet? Eat a Healthy Diet  Eat plenty of vegetables, fruits, whole grains, low-fat dairy products, and lean protein.  Do not eat a lot of foods high in solid fats, added sugars, or salt.  Maintain a Healthy Weight Regular exercise can help you achieve or maintain a healthy weight. You should:  Do at least 150 minutes of exercise each week. The exercise should increase your heart rate and make you sweat (moderate-intensity exercise).  Do strength-training exercises at least twice a week.  Watch Your Levels of Cholesterol and Blood Lipids  Have your blood tested for lipids and cholesterol every 5 years starting at 26 years of age. If you are at high risk for heart disease, you should start having your blood tested when you are 26 years old. You may need to have your cholesterol levels checked more often if: ? Your lipid or cholesterol levels are high. ? You are older than 26 years of age. ? You are at high risk for heart disease.  What should I know about cancer screening? Many types of cancers can be detected early and may often be prevented. Lung Cancer  You should be screened every year for lung cancer if: ? You are a current smoker who has smoked for at least 30 years. ? You are a former smoker who has quit within the past 15 years.  Talk to your health care provider about your screening options, when you should start screening, and how often you should be screened.  Colorectal  Cancer  Routine colorectal cancer screening usually begins at 26 years of age and should be repeated every 5-10 years until you are 26 years old. You may need to be screened more often if early forms of precancerous polyps or small growths are found. Your health care provider may recommend screening at an earlier age if you have risk factors for colon cancer.  Your health care provider may recommend using home test kits to check for hidden blood in the stool.  A small camera at the end of a tube can be used to examine your colon (sigmoidoscopy or colonoscopy). This checks for the earliest forms of colorectal cancer.  Prostate and Testicular Cancer  Depending on your age and overall health, your health care provider may do certain tests to screen for prostate and testicular cancer.  Talk to your health care provider about any symptoms or concerns you have about testicular or prostate cancer.  Skin Cancer  Check your skin from head to toe regularly.  Tell your health care provider about any new moles or changes in moles, especially if: ? There is a change in a mole's size, shape, or color. ? You have a mole that is larger than a pencil eraser.  Always use sunscreen. Apply sunscreen liberally and repeat throughout the day.  Protect yourself by wearing long sleeves, pants, a wide-brimmed hat, and sunglasses when outside.  What should I know about heart disease, diabetes, and  high blood pressure?  If you are 3318-26 years of age, have your blood pressure checked every 3-5 years. If you are 26 years of age or older, have your blood pressure checked every year. You should have your blood pressure measured twice-once when you are at a hospital or clinic, and once when you are not at a hospital or clinic. Record the average of the two measurements. To check your blood pressure when you are not at a hospital or clinic, you can use: ? An automated blood pressure machine at a pharmacy. ? A home blood  pressure monitor.  Talk to your health care provider about your target blood pressure.  If you are between 5945-26 years old, ask your health care provider if you should take aspirin to prevent heart disease.  Have regular diabetes screenings by checking your fasting blood sugar level. ? If you are at a normal weight and have a low risk for diabetes, have this test once every three years after the age of 26. ? If you are overweight and have a high risk for diabetes, consider being tested at a younger age or more often.  A one-time screening for abdominal aortic aneurysm (AAA) by ultrasound is recommended for men aged 65-75 years who are current or former smokers. What should I know about preventing infection? Hepatitis B If you have a higher risk for hepatitis B, you should be screened for this virus. Talk with your health care provider to find out if you are at risk for hepatitis B infection. Hepatitis C Blood testing is recommended for:  Everyone born from [birth year] through 1964.  Anyone with known risk factors for hepatitis C.  Sexually Transmitted Diseases (STDs)  You should be screened each year for STDs including gonorrhea and chlamydia if: ? You are sexually active and are younger than 26 years of age. ? You are older than 26 years of age and your health care provider tells you that you are at risk for this type of infection. ? Your sexual activity has changed since you were last screened and you are at an increased risk for chlamydia or gonorrhea. Ask your health care provider if you are at risk.  Talk with your health care provider about whether you are at high risk of being infected with HIV. Your health care provider may recommend a prescription medicine to help prevent HIV infection.  What else can I do?  Schedule regular health, dental, and eye exams.  Stay current with your vaccines (immunizations).  Do not use any tobacco products, such as cigarettes, chewing tobacco, and  e-cigarettes. If you need help quitting, ask your health care provider.  Limit alcohol intake to no more than 2 drinks per day. One drink equals 12 ounces of beer, 5 ounces of wine, or 1 ounces of hard liquor.  Do not use street drugs.  Do not share needles.  Ask your health care provider for help if you need support or information about quitting drugs.  Tell your health care provider if you often feel depressed.  Tell your health care provider if you have ever been abused or do not feel safe at home. This information is not intended to replace advice given to you by your health care provider. Make sure you discuss any questions you have with your health care provider. Document Released: 12/15/2007 Document Revised: 02/15/2016 Document Reviewed: 03/22/2015 Elsevier Interactive Patient Education  [employer]2018 [employer] Inc[employer]

## 2017-08-09 ENCOUNTER — Other Ambulatory Visit: Payer: BLUE CROSS/BLUE SHIELD

## 2017-08-09 DIAGNOSIS — Z130 Encounter for screening for diseases of the blood and blood-forming organs and certain disorders involving the immune mechanism: Secondary | ICD-10-CM | POA: Diagnosis not present

## 2017-08-09 LAB — CBC
HCT: 45.3 % (ref 38.5–50.0)
Hemoglobin: 15.6 g/dL (ref 13.2–17.1)
MCH: 31.8 pg (ref 27.0–33.0)
MCHC: 34.4 g/dL (ref 32.0–36.0)
MCV: 92.4 fL (ref 80.0–100.0)
MPV: 11.1 fL (ref 7.5–12.5)
Platelets: 262 10*3/uL (ref 140–400)
RBC: 4.9 10*6/uL (ref 4.20–5.80)
RDW: 12.7 % (ref 11.0–15.0)
WBC: 7.8 10*3/uL (ref 3.8–10.8)

## 2017-08-09 LAB — COMPREHENSIVE METABOLIC PANEL
ALT: 15 U/L (ref 0–53)
AST: 21 U/L (ref 0–37)
Albumin: 4.8 g/dL (ref 3.5–5.2)
Alkaline Phosphatase: 54 U/L (ref 39–117)
BILIRUBIN TOTAL: 0.7 mg/dL (ref 0.2–1.2)
BUN: 10 mg/dL (ref 6–23)
CO2: 32 mEq/L (ref 19–32)
CREATININE: 1 mg/dL (ref 0.40–1.50)
Calcium: 9.3 mg/dL (ref 8.4–10.5)
Chloride: 102 mEq/L (ref 96–112)
GFR: 96.25 mL/min (ref >60.00)
Glucose, Bld: 62 mg/dL — ABNORMAL LOW (ref 70–99)
Potassium: 3.9 mEq/L (ref 3.5–5.1)
Sodium: 138 mEq/L (ref 135–145)
Total Protein: 7.5 g/dL (ref 6.0–8.3)

## 2017-08-09 LAB — LIPID PANEL
Cholesterol: 165 mg/dL (ref 0–200)
HDL: 57.2 mg/dL (ref >39.00)
LDL Cholesterol: 92 mg/dL (ref 0–99)
NonHDL: 107.36
Total CHOL/HDL Ratio: 3
Triglycerides: 77 mg/dL (ref 0.0–149.0)
VLDL: 15.4 mg/dL (ref 0.0–40.0)

## 2017-08-09 LAB — C. TRACHOMATIS/N. GONORRHOEAE RNA
C. TRACHOMATIS RNA, TMA: NOT DETECTED
N. GONORRHOEAE RNA, TMA: NOT DETECTED

## 2017-08-09 LAB — HEMOGLOBIN A1C: Hgb A1c MFr Bld: 5.3 % (ref 4.6–6.5)

## 2017-08-12 LAB — RPR: RPR Ser Ql: NONREACTIVE

## 2017-08-12 LAB — HIV ANTIBODY (ROUTINE TESTING W REFLEX): HIV 1&2 Ab, 4th Generation: NONREACTIVE

## 2017-08-29 ENCOUNTER — Encounter: Payer: Self-pay | Admitting: Family Medicine

## 2017-08-29 DIAGNOSIS — F900 Attention-deficit hyperactivity disorder, predominantly inattentive type: Secondary | ICD-10-CM

## 2017-08-29 DIAGNOSIS — F4010 Social phobia, unspecified: Secondary | ICD-10-CM

## 2017-08-29 MED ORDER — AMPHETAMINE-DEXTROAMPHETAMINE 10 MG PO TABS: ORAL_TABLET | ORAL | 0 refills | Status: DC

## 2017-08-29 MED ORDER — LORAZEPAM 1 MG PO TABS: ORAL_TABLET | ORAL | 2 refills | Status: DC

## 2017-08-29 MED ORDER — AMPHETAMINE-DEXTROAMPHETAMINE 10 MG PO TABS: 10.0000 mg | ORAL_TABLET | Freq: Three times a day (TID) | ORAL | 0 refills | Status: DC | PRN

## 2017-10-28 ENCOUNTER — Encounter: Payer: Self-pay | Admitting: Family Medicine

## 2017-10-28 NOTE — Telephone Encounter (Signed)
Last visit here 11/29 Contract: 9/18 UDS: 9/18- low risk NCCSR:  09/30/2017  2  08/29/2017  Lorazepam 1 Mg Tablet  20 20 Je Cop  [id number]  [place] ([id number])  1 1.00 LME Comm Ins  [place]  09/27/2017  2  08/29/2017  Dextroamp-Amphetamin 10 Mg Tab  90 30 Je Cop  [id number]  [place] ([id number])  0  Comm Ins  [place]  08/29/2017  2  08/29/2017  Lorazepam 1 Mg Tablet  20 20 Je Cop  [id number]  Nor ([id number])  0 1.00 LME Comm Ins  [place]  08/29/2017  2  08/29/2017  Dextroamp-Amphetamin 10 Mg Tab  90 30 Je Cop  [id number]  [place] ([id number])  0  Comm Ins  [place]  07/30/2017  2  05/30/2017  Lorazepam 1 Mg Tablet  20 20 Je Cop  [id number]  [place] ([id number])  2 1.00 LME Comm Ins  [place]  07/30/2017  2  05/30/2017  Dextroamp-Amphetamin 10 Mg Tab  90 30 Je Cop  [id number]  Nor ([id number])  0      However I think he should have one more rx- I wrote 3 rx on 2/28, and one was to fill in 60 days  Message to pt

## 2017-11-19 ENCOUNTER — Encounter: Payer: Self-pay | Admitting: Family Medicine

## 2017-11-25 NOTE — Progress Notes (Signed)
[facility] [address] [place], [place] [address] [phone]  Date:  11/27/2017   Name:  Bob Hodges   DOB:  April 11, 1991   MRN:  [id number]  PCP:  Pearline Cables, MD    Chief Complaint: ADHD (3 month follow up, medication not working, tid )   History of Present Illness:  Bob Hodges is a 27 y.o. very pleasant male patient who presents with the following:  Following up today- history of ADD and social anxiety Last seen here in February: He is treated with adderall for his ADHD, and prn ativan for anxiety NCCSR reviewed 2/6: he is filling lorazepam and adderall about every 30 days from myself only, nothing of concern.  Did get refills on 1/29 Contract and UDS on 9/18 He is eating ok He has a new girlfriend which is going well Anxiety is under control He last ate this afternoon- we will check labs and STI  tdap is UTD Flu: he declines a flu shot today  Discussed gardasil with him- he wants to think about this. I gave him some material to read about it He continues to suffer from cystic acne, but this seems to be somewhat better than in the past which is good He did not send up seeing dermatology about accutane but may still end up doing this   UDS 9/18 NCCSR:   10/28/2017  1  08/29/2017  Lorazepam 1 Mg Tablet  20 20 Je Cop  [id number]  [place] ([id number])  2 1.00 LME Comm Ins  [place]  10/28/2017  1  08/29/2017  Dextroamp-Amphetamin 10 Mg Tab  90 30 Je Cop  [id number]  [place] ([id number])  0  Comm Ins  [place]  09/30/2017  1  08/29/2017  Lorazepam 1 Mg Tablet  20 20 Je Cop  [id number]  [place] ([id number])  1 1.00 LME Comm Ins  [place]  09/27/2017  1  08/29/2017  Dextroamp-Amphetamin 10 Mg Tab  90 30 Je Cop  [id number]  [place] ([id number])  0  Comm Ins  [place]  08/29/2017  1  08/29/2017  Lorazepam 1 Mg Tablet  20 20 Je Cop  [id number]  [place] ([id number])  0 1.00 LME Comm Ins  [place]  08/29/2017  1  08/29/2017  Dextroamp-Amphetamin 10 Mg Tab  90 30 Je Cop  [id number]  [place] ([id number])  0   Comm Ins  [place]  07/30/2017  1  05/30/2017  Lorazepam 1 Mg Tablet  20 20 Je Cop  [id number]  [place] ([id number])  2 1.00 LME Comm Ins    07/30/2017  1  05/30/2017  Dextroamp-Amphetamin 10 Mg Tab  90 30 Je Cop  [id number]  [place] ([id number])  0      Pt reports that he has generally done well, but that this adderall does not seem to be working quite as well recently.  Also, when he tries to not take it- like on the weekends- he feels like he just can't get motivated to do antying.  He will just lie around and not do the errands he needs to do  He does not feel like he is depressed, but does continue to have sx of anxiety He has been using lorazepam as needed for anxiety- in fact he has experimented with taking a pill and a half instead of his rx dose.  This does seem to work better for him, but explained that we do not want to continue to go up  on his doses  Discussed options with him.  He thinks that he took cymbalta at some point but it did not really help.  He would be interested in trying a medication for anxiety that does not have sexual side effects.  Also, I do suspect some element of depression as when he does not take his adderall he does not feel scattered, but lethargic.  Will try wellbutrin for him at a low dose   BP Readings from Last 3 Encounters:  11/27/17 140/80  08/08/17 (!) 144/72  05/30/17 132/70  never had a seizure No SI He is SA with one consistent male partner  Patient Active Problem List   Diagnosis Date Noted  . Social anxiety disorder 03/22/2016  . Muscle pain 03/22/2016  . Other seasonal allergic rhinitis 03/22/2016  . Tobacco abuse 02/07/2012  . ACNE VULGARIS 06/05/2010  . ADHD 04/13/2008    Past Medical History:  Diagnosis Date  . ADHD (attention deficit hyperactivity disorder)     History reviewed. No pertinent surgical history.  Social History   Tobacco Use  . Smoking status: Current Every Day Smoker    Packs/day: 0.50    Years: 4.00    Pack years: 2.00    Types:  Cigarettes  . Smokeless tobacco: Never Used  Substance Use Topics  . Alcohol use: Yes  . Drug use: No    Family History  Problem Relation Age of Onset  . Lupus Mother     Allergies  Allergen Reactions  . Naproxen Other (See Comments)    Tunnel vision, blacked out  . Tylenol [Acetaminophen] Hives and Shortness Of Breath    Medication list has been reviewed and updated.  Current Outpatient Medications on File Prior to Visit  Medication Sig Dispense Refill  . ibuprofen (ADVIL,MOTRIN) 200 MG tablet Take 200 mg by mouth every 6 (six) hours as needed for pain (PAIN).    Marland Kitchen doxycycline (PERIOSTAT) 20 MG tablet Take 1 tablet (20 mg total) by mouth 2 (two) times daily. (Patient not taking: Reported on 11/27/2017) 60 tablet 6  . doxycycline (VIBRAMYCIN) 100 MG capsule Take 1 capsule (100 mg total) by mouth 2 (two) times daily. (Patient not taking: Reported on 11/27/2017) 20 capsule 0   No current facility-administered medications on file prior to visit.     Review of Systems:  As per HPI- otherwise negative.   Physical Examination: Vitals:   11/27/17 1527  BP: 140/80  Pulse: 84  Resp: 16  SpO2: 98%   Vitals:   11/27/17 1527  Weight: 182 lb (82.6 kg)  Height:  (1.88 m)   Body mass index is 23.37 kg/m. Ideal Body Weight: Weight in (lb) to have BMI = 25: 194.3  GEN: WDWN, NAD, Non-toxic, A & O x 3, looks well, normal weight His acne looks better HEENT: Atraumatic, Normocephalic. Neck supple. No masses, No LAD. Ears and Nose: No external deformity. CV: RRR, No M/G/R. No JVD. No thrill. No extra heart sounds. PULM: CTA B, no wheezes, crackles, rhonchi. No retractions. No resp. distress. No accessory muscle use. ABD: S, NT, ND, +BS. No rebound. No HSM. EXTR: No c/c/e NEURO Normal gait.  PSYCH: Normally interactive. Conversant. Not depressed or anxious appearing.  Calm demeanor.    Assessment and Plan: ADHD (attention deficit hyperactivity disorder), inattentive type  - Plan: amphetamine-dextroamphetamine (ADDERALL) 10 MG tablet, amphetamine-dextroamphetamine (ADDERALL) 10 MG tablet, amphetamine-dextroamphetamine (ADDERALL) 10 MG tablet, buPROPion (WELLBUTRIN) 75 MG tablet  Social anxiety disorder - Plan: LORazepam (ATIVAN) 1  MG tablet, buPROPion (WELLBUTRIN) 75 MG tablet  Cystic acne  Refilled adderall and lorazepam for him Will start on 75 mg of wellbutrin a day- start with lower dose as he is also on adderall, levels of stimulant can be increased by this combination  Signed Abbe Amsterdam, MD

## 2017-11-27 ENCOUNTER — Encounter: Payer: Self-pay | Admitting: Family Medicine

## 2017-11-27 ENCOUNTER — Ambulatory Visit: Payer: BLUE CROSS/BLUE SHIELD | Admitting: Family Medicine

## 2017-11-27 DIAGNOSIS — F4010 Social phobia, unspecified: Secondary | ICD-10-CM

## 2017-11-27 DIAGNOSIS — F900 Attention-deficit hyperactivity disorder, predominantly inattentive type: Secondary | ICD-10-CM | POA: Diagnosis not present

## 2017-11-27 DIAGNOSIS — L700 Acne vulgaris: Secondary | ICD-10-CM

## 2017-11-27 MED ORDER — AMPHETAMINE-DEXTROAMPHETAMINE 10 MG PO TABS: 10.0000 mg | ORAL_TABLET | Freq: Three times a day (TID) | ORAL | 0 refills | Status: DC | PRN

## 2017-11-27 MED ORDER — BUPROPION HCL 75 MG PO TABS: 75.0000 mg | ORAL_TABLET | Freq: Two times a day (BID) | ORAL | 3 refills | Status: DC

## 2017-11-27 MED ORDER — AMPHETAMINE-DEXTROAMPHETAMINE 10 MG PO TABS
ORAL_TABLET | ORAL | 0 refills | Status: DC
Start: 2017-11-27 — End: 2018-02-06

## 2017-11-27 MED ORDER — LORAZEPAM 1 MG PO TABS: ORAL_TABLET | ORAL | 2 refills | Status: DC

## 2017-11-27 NOTE — Patient Instructions (Signed)
Good to see you today- please add wellbutrin (bupropion) 75 mg twice a day to your regimen and let me know how this works for you  Take care!

## 2017-12-19 ENCOUNTER — Other Ambulatory Visit: Payer: Self-pay | Admitting: Family Medicine

## 2017-12-19 DIAGNOSIS — F4010 Social phobia, unspecified: Secondary | ICD-10-CM

## 2017-12-19 DIAGNOSIS — F900 Attention-deficit hyperactivity disorder, predominantly inattentive type: Secondary | ICD-10-CM

## 2018-02-04 NOTE — Progress Notes (Signed)
[facility] at [facility] [address] [place], [place] [address] [phone]  Date:  02/06/2018   Name:  Bob Hodges   DOB:  06/01/1991   MRN:  [id number]  PCP:  Pearline Cables, MD    Chief Complaint: ADHD (6 month follow up, no concerns) and Anxiety   History of Present Illness:  Bob Hodges is a 27 y.o. very pleasant male patient who presents with the following:  Following up on his medications for ADHD and anxiety today Last seen here at the end of May at which time we added some wellbutrin to his regimen:  Pt reports that he has generally done well, but that this adderall does not seem to be working quite as well recently.  Also, when he tries to not take it- like on the weekends- he feels like he just can't get motivated to do antying.  He will just lie around and not do the errands he needs to do  He does not feel like he is depressed, but does continue to have sx of anxiety He has been using lorazepam as needed for anxiety- in fact he has experimented with taking a pill and a half instead of his rx dose.  This does seem to work better for him, but explained that we do not want to continue to go up on his doses Discussed options with him.  He thinks that he took cymbalta at some point but it did not really help.  He would be interested in trying a medication for anxiety that does not have sexual side effects.  Also, I do suspect some element of depression as when he does not take his adderall he does not feel scattered, but lethargic.  Will try wellbutrin for him at a low dose//////////////////////////////////////// Refilled adderall and lorazepam for him Will start on 75 mg of wellbutrin a day- start with lower dose as he is also on adderall, levels of stimulant can be increased by this combination   NCCSR:  01/26/2018  1  11/27/2017  Dextroamp-Amphetamin 10 Mg Tab  90.00 30 Je Cop  [id number]  [place] (2879)  1/1  Comm Ins  [place]   01/26/2018  1  11/27/2017  Lorazepam 1 Mg Tablet  20.00 20 Je Cop  [id number]  [place] (2879)  3/2 1.00 LME Comm Ins  [place]  12/27/2017  1  11/27/2017  Dextroamp-Amphetamin 10 Mg Tab  90.00 30 Je Cop  [id number]  [place] (2879)  1/1  Comm Ins  [place]  12/27/2017  1  11/27/2017  Lorazepam 1 Mg Tablet  20.00 20 Je Cop  [id number]  [place] (2879)  2/2 1.00 LME Comm Ins  [place]  11/27/2017  1  11/27/2017  Lorazepam 1 Mg Tablet  20.00 20 Je Cop  [id number]  [place] (2879)  1/2 1.00 LME Comm Ins  [place]  11/27/2017  1  11/27/2017  Dextroamp-Amphetamin 10 Mg Tab  90.00 30 Je Cop  [id number]  [place] (2879)  1/1  Comm Ins  [place]  10/28/2017  1  08/29/2017  Lorazepam 1 Mg Tablet  20.00 20 Je Cop  [id number]  [place] (2879)  3/2 1.00 LME Comm Ins  [place]  10/28/2017  1  08/29/2017  Dextroamp-Amphetamin 10 Mg Tab  90.00 30 Je Cop  [id number]  [place] (2879)  1/1  Comm Ins  [place]  09/30/2017  1  08/29/2017  Lorazepam 1 Mg Tablet  20.00 20 Je Cop  [id number]  [place] (2879)  2/2 1      UDS:due, will do today. Pt advised me that he does use MJ Contract done   He upped his wellbutin to BID. He feels like this working well for him and is helping with residual anxiety  He is using lorazepam not that frequently Things at work are good    Patient Active Problem List   Diagnosis Date Noted  . Social anxiety disorder 03/22/2016  . Muscle pain 03/22/2016  . Other seasonal allergic rhinitis 03/22/2016  . Tobacco abuse 02/07/2012  . ACNE VULGARIS 06/05/2010  . ADHD 04/13/2008    Past Medical History:  Diagnosis Date  . ADHD (attention deficit hyperactivity disorder)     History reviewed. No pertinent surgical history.  Social History   Tobacco Use  . Smoking status: Current Every Day Smoker    Packs/day: 0.50    Years: 4.00    Pack years: 2.00    Types: Cigarettes  . Smokeless tobacco: Never Used  Substance Use Topics  . Alcohol use: Yes  . Drug use: No    Family History  Problem Relation Age of Onset  . Lupus Mother     Allergies  Allergen Reactions  .  Naproxen Other (See Comments)    Tunnel vision, blacked out  . Tylenol [Acetaminophen] Hives and Shortness Of Breath    Medication list has been reviewed and updated.  Current Outpatient Medications on File Prior to Visit  Medication Sig Dispense Refill  . amphetamine-dextroamphetamine (ADDERALL) 10 MG tablet Take 1 tablet (10 mg total) by mouth 3 (three) times daily as needed. 90 tablet 0  . amphetamine-dextroamphetamine (ADDERALL) 10 MG tablet Take 1 tablet (10 mg total) by mouth 3 (three) times daily as needed. Ok to fill in 60 days 90 tablet 0  . amphetamine-dextroamphetamine (ADDERALL) 10 MG tablet Take 1 tablet TID prn 90 tablet 0  . buPROPion (WELLBUTRIN) 75 MG tablet TAKE 1 TABLET BY MOUTH TWICE A DAY 180 tablet 1  . ibuprofen (ADVIL,MOTRIN) 200 MG tablet Take 200 mg by mouth every 6 (six) hours as needed for pain (PAIN).    . LORazepam (ATIVAN) 1 MG tablet Use 1/2 or 1 prior to social occasion as needed for anxiety 20 tablet 2   No current facility-administered medications on file prior to visit.     Review of Systems:  As per HPI- otherwise negative. No fever or chills No CP or SOB    Physical Examination: Vitals:   02/06/18 1642  BP: 136/80  Pulse: 84  Resp: 16  Temp: 98.2 F (36.8 C)  SpO2: 98%   Vitals:   02/06/18 1642  Weight: 186 lb (84.4 kg)  Height: 6\' 2"  (1.88 m)   Body mass index is 23.88 kg/m. Ideal Body Weight: Weight in (lb) to have BMI = 25: 194.3  GEN: WDWN, NAD, Non-toxic, A & O x 3, looks well and his normal self  HEENT: Atraumatic, Normocephalic. Neck supple. No masses, No LAD.  Bilateral TM wnl, oropharynx normal.  PEERL,EOMI.   Ears and Nose: No external deformity. CV: RRR, No M/G/R. No JVD. No thrill. No extra heart sounds. PULM: CTA B, no wheezes, crackles, rhonchi. No retractions. No resp. distress. No accessory muscle use. EXTR: No c/c/e NEURO Normal gait.  PSYCH: Normally interactive. Conversant. Not depressed or anxious appearing.   Calm demeanor.    Assessment and Plan: Social anxiety disorder  ADHD (attention deficit hyperactivity disorder), inattentive type - Plan: Pain Mgmt, Profile 8 w/Conf,  U, amphetamine-dextroamphetamine (ADDERALL) 10 MG tablet, amphetamine-dextroamphetamine (ADDERALL) 10 MG tablet, amphetamine-dextroamphetamine (ADDERALL) 10 MG tablet  Following up today- he is doing well on current regimen rx for adderall for 3 months UDS today   Signed Abbe AmsterdamJessica Genean Adamski, MD

## 2018-02-06 ENCOUNTER — Ambulatory Visit: Payer: BLUE CROSS/BLUE SHIELD | Admitting: Family Medicine

## 2018-02-06 ENCOUNTER — Encounter: Payer: Self-pay | Admitting: Family Medicine

## 2018-02-06 VITALS — BP 136/80 | HR 84 | Temp 98.2°F | Resp 16 | Ht 74.0 in | Wt 186.0 lb

## 2018-02-06 DIAGNOSIS — F4010 Social phobia, unspecified: Secondary | ICD-10-CM

## 2018-02-06 DIAGNOSIS — F900 Attention-deficit hyperactivity disorder, predominantly inattentive type: Secondary | ICD-10-CM | POA: Diagnosis not present

## 2018-02-06 MED ORDER — AMPHETAMINE-DEXTROAMPHETAMINE 10 MG PO TABS: 10.0000 mg | ORAL_TABLET | Freq: Three times a day (TID) | ORAL | 0 refills | Status: DC | PRN

## 2018-02-06 MED ORDER — AMPHETAMINE-DEXTROAMPHETAMINE 10 MG PO TABS: ORAL_TABLET | ORAL | 0 refills | Status: DC

## 2018-02-06 NOTE — Patient Instructions (Signed)
It was good to see you today- I am glad that all is going well for you Please stop by the lab and do your annual urine test today

## 2018-02-08 LAB — PAIN MGMT, PROFILE 8 W/CONF, U
6 ACETYLMORPHINE: NEGATIVE ng/mL (ref <10)
AMPHETAMINES: POSITIVE ng/mL — AB (ref <500)
Alcohol Metabolites: NEGATIVE ng/mL (ref <500)
Amphetamine: 1008 ng/mL — ABNORMAL HIGH (ref <250)
BENZODIAZEPINES: NEGATIVE ng/mL (ref <100)
Buprenorphine, Urine: NEGATIVE ng/mL (ref <5)
Cocaine Metabolite: NEGATIVE ng/mL (ref <150)
Creatinine: 19.6 mg/dL — ABNORMAL LOW
MARIJUANA METABOLITE: 20 ng/mL — AB (ref <5)
MARIJUANA METABOLITE: POSITIVE ng/mL — AB (ref <20)
MDMA: NEGATIVE ng/mL (ref <500)
METHAMPHETAMINE: NEGATIVE ng/mL (ref <250)
OPIATES: NEGATIVE ng/mL (ref <100)
OXIDANT: NEGATIVE ug/mL (ref <200)
Oxycodone: NEGATIVE ng/mL (ref <100)
SPECIFIC GRAVITY: 1.003 (ref >=1.0)
pH: 6.79 (ref 4.5–9.0)

## 2018-02-27 ENCOUNTER — Other Ambulatory Visit: Payer: Self-pay | Admitting: Family Medicine

## 2018-02-27 DIAGNOSIS — F4010 Social phobia, unspecified: Secondary | ICD-10-CM

## 2018-02-27 NOTE — Telephone Encounter (Signed)
Requesting:Ativan Contract:03/07/17 UDS:02/06/18  Last Visit:02/06/18 Next Visit:none Last Refill:11/27/17 #20 2 refills  Please Advise

## 2018-05-30 ENCOUNTER — Encounter: Payer: Self-pay | Admitting: Family Medicine

## 2018-05-30 DIAGNOSIS — F900 Attention-deficit hyperactivity disorder, predominantly inattentive type: Secondary | ICD-10-CM

## 2018-05-30 DIAGNOSIS — F4010 Social phobia, unspecified: Secondary | ICD-10-CM

## 2018-05-31 MED ORDER — AMPHETAMINE-DEXTROAMPHETAMINE 10 MG PO TABS: 10.0000 mg | ORAL_TABLET | Freq: Three times a day (TID) | ORAL | 0 refills | Status: DC | PRN

## 2018-05-31 MED ORDER — AMPHETAMINE-DEXTROAMPHETAMINE 10 MG PO TABS: ORAL_TABLET | ORAL | 0 refills | Status: DC

## 2018-05-31 MED ORDER — LORAZEPAM 1 MG PO TABS: ORAL_TABLET | ORAL | 2 refills | Status: DC

## 2018-05-31 NOTE — Telephone Encounter (Signed)
Last seen here in August, UDS is UTD Reviewed NCCSR Ok to refill NCCSR:  04/29/2018  2   02/27/2018  Lorazepam 1 Mg Tablet  20.00 20 Je Cop  [id number]  [place] (2879)  2/2 1.00 LME Comm Ins  [place]  04/29/2018  2   02/06/2018  Dextroamp-Amphetamin 10 Mg Tab  90.00 30 Je Cop  [id number]  [place] (2879)  0/0  Comm Ins  [place]  03/30/2018  2   02/27/2018  Lorazepam 1 Mg Tablet  20.00 20 Je Cop  [id number]  [place] (2879)  1/2 1.00 LME Comm Ins  [place]  03/30/2018  2   02/06/2018  Dextroamp-Amphetamin 10 Mg Tab  90.00 30 Je Cop  [id number]  [place] (2879)  0/0  Comm Ins  [place]  02/27/2018  2   02/27/2018  Lorazepam 1 Mg Tablet  20.00 20 Je Cop  [id number]  [place] (2879)  0/2 1.00 LME Comm Ins  [place]  02/27/2018  2   02/06/2018  Dextroamp-Amphetamin 10 Mg Tab  90.00 30 Je Cop  [id number]  [place] (2879)  0/0  Comm Ins  [place]  01/26/2018  2   11/27/2017  Dextroamp-Amphetamin 10 Mg Tab  90.00 30 Je Cop  [id number]  [place] (2879)  0/0  Comm Ins  [place]  01/26/2018  2   11/27/2017  Lorazepam 1 Mg Tablet  20.00 20 Je Cop  [id number]  [place] (2879)  2/2 1.00 LME Comm Ins  [place]  12/27/2017  2   11/27/2017  Dextroamp-Amphetamin 10 Mg Tab  90.00 30 Je Cop  [id number]  [place] (2879)  0/0  Comm Ins  [place]  12/27/2017  2   11/27/2017  Lorazepam 1 Mg Tablet  20.00 20 Je Cop  [id number]  [place] (2879)  1/2 1.00 LME Comm Ins  [place]  11/27/2017  2   11/27/2017  Dextroamp-Amphetamin 10 Mg Tab  90.00 30 Je Cop  [id number]  [place] (2879)  0/0  Comm Ins  [place]  11/27/2017  2   11/27/2017  Lorazepam 1 Mg Tablet  20.00 20 Je Cop  [id number]  [place] (2879)  0/2 1.00 LME Comm Ins    10/28/2017  2   08/29/2017  Lorazepam 1 Mg Tablet  20.00 20 Je Cop  [id number]  [place] (2879)  2/2 1.00 LME Comm Ins    10/28/2017  2   08/29/2017  Dextroamp-Amphetamin 10 Mg Tab  90.00 30 Je Cop  [id number]  Nor (2879)  0/0

## 2018-06-28 DIAGNOSIS — F4320 Adjustment disorder, unspecified: Secondary | ICD-10-CM | POA: Diagnosis not present

## 2018-07-06 DIAGNOSIS — F4320 Adjustment disorder, unspecified: Secondary | ICD-10-CM | POA: Diagnosis not present

## 2018-08-18 NOTE — Progress Notes (Addendum)
[facility] at [facility] [address] [place], [place] [address] [phone]  Date:  08/20/2018   Name:  Bob Hodges   DOB:  June 30, 1991   MRN:  [id number]  PCP:  Pearline Cables, MD    Chief Complaint: Annual Exam (std checking)   History of Present Illness:  Bob Hodges is a 28 y.o. very pleasant male patient who presents with the following:  Here today for a physical, I last saw him in August 2019 Bob Hodges does suffer from ADHD and anxiety, mostly social anxiety At her last visit we had added Wellbutrin to his regimen, and he did feel like it was helping Uses Adderall for ADHD, and Lorazepam as needed UDS was done in August Otherwise routine lab work a year ago Flu shot; he declines to do today Tetanus is up-to-date  He feels like his anxiety is still pretty bad-he admits that he has been there are some rough times over the past year, but does not go into much detail.  He does disclose that he was actually in a hotel for a few months.  He is now back in a permanent home He may feel moody and irritable for the last few months- however looking back this has been present since at least his teens  He has cut down on alcohol and thinks this may be why he is noticing his moods more -he is no longer covering them up He would like to try going up on his wellbutrin; however if this is not helpful we might need to have his see a psychiatrist, he is open to this idea He did see a therapist briefly; he stopped due to finances but is thinking of going back to idea He did feel like counseling helped him No SI at all   He just had some labs done through his job- to check CHL, etc   He notes a recent weight loss of about 10 pounds. He is not sure how much diet change may have contributed to his weight change He is no longer getting calories through alcohol, but notes that he started drinking soda to take its place He was living in a  hotel for a few months and had a harder time getting food  He notes a lifelong aversion to crunchy foods such as uncooked green vegetables.  This does limit his diet some, he plans to start on a multivitamin Wt Readings from Last 3 Encounters:  08/20/18 174 lb (78.9 kg)  02/06/18 186 lb (84.4 kg)  11/27/17 182 lb (82.6 kg)   08/01/2018  3   05/31/2018  Dextroamp-Amphetamin 10 MG Tab  90.00 30 Bob Hodges   [id number]   [place] (5429)   0   Comm Ins   [place]  06/30/2018  3   05/31/2018  Lorazepam 1 MG Tablet  20.00 20 Bob Hodges   [id number]   [place] (5429)   1  1.00 LME  Comm Ins   [place]  06/30/2018  3   05/31/2018  Dextroamp-Amphetamin 10 MG Tab  90.00 30 Bob Hodges   [id number]   [place] (5429)   0   Comm Ins   [place]  05/31/2018  3   05/31/2018  Dextroamp-Amphetamin 10 MG Tab  90.00 30 Bob Hodges   [id number]   [place] (5429)   0   Comm Ins   [place]  05/31/2018  3   05/31/2018  Lorazepam 1 MG Tablet  20.00 20 Bob Hodges   [id number]   [place] (5429)   0  1.00 LME  Comm Ins   [place]  04/29/2018  2   02/06/2018  Dextroamp-Amphetamin 10 MG Tab  90.00 30 Bob Hodges   [id number]   [place] (2879)   0   Comm Ins   [place]  04/29/2018  2   02/27/2018  Lorazepam 1 MG Tablet  20.00 20 Bob Hodges   [id number]   [place] (2879)   2  1.00 LME  Comm Ins   [place]  03/30/2018  2   02/06/2018  Dextroamp-Amphetamin 10 MG Tab  90.00 30 Bob Hodges   [id number]   [place] (2879)   0   Comm Ins   [place]  03/30/2018  2   02/27/2018  Lorazepam 1 MG Tablet  20.00 20 Bob Hodges   [id number]   [place] (2879)   1  1.00 LME  Comm Ins   [place]  02/27/2018  2   02/06/2018  Dextroamp-Amphetamin 10 MG Tab  90.00 30 Bob Hodges   [id number]   [place] (2879)   0   Comm Ins   [place]  02/27/2018  2   02/27/2018  Lorazepam 1 MG Tablet  20.00 20 Bob Hodges   [id number]   [place] (2879)   0  1.00 LME       08/01/2018  3   05/31/2018  Dextroamp-Amphetamin 10 MG Tab  90.00 30 Bob Hodges   [id number]   [place] (5429)   0   Comm Ins   [place]  06/30/2018  3   05/31/2018  Lorazepam 1 MG Tablet  20.00 20 Bob Hodges   [id number]   [place] (5429)   1  1.00 LME  Comm Ins   [place]  06/30/2018  3   05/31/2018   Dextroamp-Amphetamin 10 MG Tab  90.00 30 Bob Hodges   [id number]   [place] (5429)   0   Comm Ins   [place]  05/31/2018  3   05/31/2018  Dextroamp-Amphetamin 10 MG Tab  90.00 30 Bob Hodges   [id number]   [place] (5429)   0   Comm Ins   [place]  05/31/2018  3   05/31/2018  Lorazepam 1 MG Tablet  20.00 20 Bob Hodges   [id number]   [place] (5429)   0  1.00 LME  Comm Ins   [place]  04/29/2018  2   02/06/2018  Dextroamp-Amphetamin 10 MG Tab  90.00 30 Bob Hodges   [id number]   [place] (2879)   0   Comm Ins   [place]  04/29/2018  2   02/27/2018  Lorazepam 1 MG Tablet  20.00 20 Bob Hodges   [id number]   [place] (2879)   2  1.00 LME  Comm Ins   [place]  03/30/2018  2   02/06/2018  Dextroamp-Amphetamin 10 MG Tab  90.00 30 Bob Hodges   [id number]   [place] (2879)   0   Comm Ins   [place]  03/30/2018  2   02/27/2018  Lorazepam 1 MG Tablet  20.00 20 Bob Hodges   [id number]   [place] (2879)   1  1.00 LME  Comm Ins   [place]  02/27/2018  2   02/06/2018  Dextroamp-Amphetamin 10 MG Tab  90.00 30 Bob Hodges   [id number]   [place] (2879)   0   Comm Ins   [place]  02/27/2018  2   02/27/2018  Lorazepam 1 MG Tablet  20.00 20 Bob Hodges  [id number]   [place] (2879)   0  1.00 LME  Comm Ins   [place]  01/26/2018  2   11/27/2017  Lorazepam 1 MG Tablet  20.00 20 Bob Hodges   [id number]   [place] (2879)   2  1.00 LME  Comm Ins   [place]  01/26/2018  2   11/27/2017  Dextroamp-Amphetamin 10 MG Tab  90.00 30 Bob Hodges   [id number]   [place] (2879)   0   Comm Ins   [place]  12/27/2017  2   11/27/2017  Lorazepam 1 MG Tablet  20.00 20 Bob Hodges   [id number]   [place] (2879)   1  1.00 LME  Comm Ins   [place]  12/27/2017  2   11/27/2017  Dextroamp-Amphetamin 10 MG Tab  90.00 30 Bob Hodges   [id number]   [place] (2879)   0   Comm Ins   [place]  11/27/2017  2   11/27/2017  Lorazepam 1 MG Tablet  20.00 20 Bob Hodges   [id number]   [place] (2879)   0  1.00 LME  Comm Ins     11/27/2017  2   11/27/2017  Dextroamp-Amphetamin 10 MG Tab  90.00 30 Bob Hodges   [id number]   [place] (2879)   0   Comm Ins     10/28/2017  2   08/29/2017  Dextroamp-Amphetamin 10 MG Tab  90.00 30 Bob Hodges   [id number]   [place] (2879)   0         Patient Active Problem List    Diagnosis Date Noted  . Social anxiety disorder 03/22/2016  . Muscle pain 03/22/2016  . Other seasonal allergic rhinitis 03/22/2016  . Tobacco abuse 02/07/2012  . ACNE VULGARIS 06/05/2010  . ADHD 04/13/2008    Past Medical History:  Diagnosis Date  . ADHD (attention deficit hyperactivity disorder)     History reviewed. No pertinent surgical history.  Social History   Tobacco Use  . Smoking status: Current Every Day Smoker    Packs/day: 0.50    Years: 4.00    Pack years: 2.00    Types: Cigarettes  . Smokeless tobacco: Never Used  Substance Use Topics  . Alcohol use: Yes  . Drug use: No    Family History  Problem Relation Age of Onset  . Lupus Mother     Allergies  Allergen Reactions  . Naproxen Other (See Comments)    Tunnel vision, blacked out  . Tylenol [Acetaminophen] Hives and Shortness Of Breath    Medication list has been reviewed and updated.  Current Outpatient Medications on File Prior to Visit  Medication Sig Dispense Refill  . buPROPion (WELLBUTRIN) 75 MG tablet TAKE 1 TABLET BY MOUTH TWICE A DAY 180 tablet 1  . ibuprofen (ADVIL,MOTRIN) 200 MG tablet Take 200 mg by mouth every 6 (six) hours as needed for pain (PAIN).     No current facility-administered medications on file prior to visit.     Review of Systems:  As per HPI- otherwise negative. No fever or chills, no chest pain or shortness of breath  Physical Examination: Vitals:   08/20/18 0943  BP: 130/80  Pulse: 96  Resp: 16  Temp: 98.1 F (36.7 C)  SpO2: 97%   Vitals:   08/20/18 0943  Weight: 174 lb (78.9 kg)  Height: 6\' 2"  (1.88 m)   Body mass index is 22.34 kg/m. Ideal Body Weight: Weight in (lb) to have BMI = 25: 194.3  GEN: WDWN, NAD,  Non-toxic, A & O x 3, looks well, has lost weight but is still in normal range HEENT: Atraumatic, Normocephalic. Neck supple. No masses, No LAD. Ears and Nose: No external deformity. CV: RRR, No M/G/R. No JVD. No thrill. No extra heart  sounds. PULM: CTA B, no wheezes, crackles, rhonchi. No retractions. No resp. distress. No accessory muscle use. ABD: S, NT, ND, +BS. No rebound. No HSM. EXTR: No c/c/e NEURO Normal gait.  PSYCH: Normally interactive. Conversant. Not depressed or anxious appearing.  Calm demeanor.    Assessment and Plan: Physical exam  Routine screening for STI (sexually transmitted infection) - Plan: HIV Antibody (routine testing w rflx), RPR, Hepatitis B surface antibody,quantitative, Hepatitis C antibody, Hepatitis B surface antigen, C. trachomatis/N. gonorrhoeae RNA  Weight loss - Plan: TSH, Hemoglobin A1c, CBC  ADHD (attention deficit hyperactivity disorder), inattentive type - Plan: amphetamine-dextroamphetamine (ADDERALL) 10 MG tablet, amphetamine-dextroamphetamine (ADDERALL) 10 MG tablet, amphetamine-dextroamphetamine (ADDERALL) 10 MG tablet  Social anxiety disorder - Plan: buPROPion (WELLBUTRIN SR) 100 MG 12 hr tablet, LORazepam (ATIVAN) 1 MG tablet  Here today for physical exam Bob Hodges would like routine STI screening, as above. He has lost some weight, which may be due to life circumstances change and anxiety.  He denies any abdominal pain, fevers, or unusual cough.  I will check a TSH, A1c, CBC  Refilled his Adderall at normal dose Also refilled Ativan We will try increasing his Wellbutrin to 100 mg twice a day-he will let me know how this works for him  Signed Abbe Amsterdam, MD  Received his labs   Results for orders placed or performed in visit on 08/20/18  C. trachomatis/N. gonorrhoeae RNA  Result Value Ref Range   C. trachomatis RNA, TMA NOT DETECTED NOT DETECT   N. gonorrhoeae RNA, TMA NOT DETECTED NOT DETECT  HIV Antibody (routine testing w rflx)  Result Value Ref Range   HIV 1&2 Ab, 4th Generation NON-REACTIVE NON-REACTI  RPR  Result Value Ref Range   RPR Ser Ql NON-REACTIVE NON-REACTI  Hepatitis B surface antibody,quantitative  Result Value Ref Range   Hepatitis B-Post  <5 (L) > OR = 10 mIU/mL  Hepatitis C antibody  Result Value Ref Range   Hepatitis C Ab NON-REACTIVE NON-REACTI   SIGNAL TO CUT-OFF 0.02 <1.00  Hepatitis B surface antigen  Result Value Ref Range   Hepatitis B Surface Ag NON-REACTIVE NON-REACTI  TSH  Result Value Ref Range   TSH 1.12 0.35 - 4.50 uIU/mL  Hemoglobin A1c  Result Value Ref Range   Hgb A1c MFr Bld 5.4 4.6 - 6.5 %  CBC  Result Value Ref Range   WBC 7.3 4.0 - 10.5 K/uL   RBC 4.94 4.22 - 5.81 Mil/uL   Platelets 247.0 150.0 - 400.0 K/uL   Hemoglobin 15.5 13.0 - 17.0 g/dL   HCT 29.4 76.5 - 46.5 %   MCV 94.1 78.0 - 100.0 fl   MCHC 33.5 30.0 - 36.0 g/dL   RDW 03.5 46.5 - 68.1 %

## 2018-08-20 ENCOUNTER — Ambulatory Visit (INDEPENDENT_AMBULATORY_CARE_PROVIDER_SITE_OTHER): Payer: BLUE CROSS/BLUE SHIELD | Admitting: Family Medicine

## 2018-08-20 ENCOUNTER — Encounter: Payer: Self-pay | Admitting: Family Medicine

## 2018-08-20 VITALS — BP 130/80 | HR 96 | Temp 98.1°F | Resp 16 | Ht 74.0 in | Wt 174.0 lb

## 2018-08-20 DIAGNOSIS — Z0000 Encounter for general adult medical examination without abnormal findings: Secondary | ICD-10-CM | POA: Diagnosis not present

## 2018-08-20 DIAGNOSIS — R634 Abnormal weight loss: Secondary | ICD-10-CM

## 2018-08-20 DIAGNOSIS — Z113 Encounter for screening for infections with a predominantly sexual mode of transmission: Secondary | ICD-10-CM

## 2018-08-20 DIAGNOSIS — F4010 Social phobia, unspecified: Secondary | ICD-10-CM

## 2018-08-20 DIAGNOSIS — F900 Attention-deficit hyperactivity disorder, predominantly inattentive type: Secondary | ICD-10-CM | POA: Diagnosis not present

## 2018-08-20 LAB — TSH: TSH: 1.12 u[IU]/mL (ref 0.35–4.50)

## 2018-08-20 LAB — CBC
HCT: 46.5 % (ref 39.0–52.0)
Hemoglobin: 15.5 g/dL (ref 13.0–17.0)
MCHC: 33.5 g/dL (ref 30.0–36.0)
MCV: 94.1 fl (ref 78.0–100.0)
Platelets: 247 10*3/uL (ref 150.0–400.0)
RBC: 4.94 Mil/uL (ref 4.22–5.81)
RDW: 13 % (ref 11.5–15.5)
WBC: 7.3 10*3/uL (ref 4.0–10.5)

## 2018-08-20 LAB — HEMOGLOBIN A1C: Hgb A1c MFr Bld: 5.4 % (ref 4.6–6.5)

## 2018-08-20 MED ORDER — AMPHETAMINE-DEXTROAMPHETAMINE 10 MG PO TABS: ORAL_TABLET | ORAL | 0 refills | Status: DC

## 2018-08-20 MED ORDER — AMPHETAMINE-DEXTROAMPHETAMINE 10 MG PO TABS: 10.0000 mg | ORAL_TABLET | Freq: Three times a day (TID) | ORAL | 0 refills | Status: DC | PRN

## 2018-08-20 MED ORDER — LORAZEPAM 1 MG PO TABS: ORAL_TABLET | ORAL | 2 refills | Status: DC

## 2018-08-20 MED ORDER — BUPROPION HCL ER (SR) 100 MG PO TB12: 100.0000 mg | ORAL_TABLET | Freq: Two times a day (BID) | ORAL | 3 refills | Status: DC

## 2018-08-20 NOTE — Patient Instructions (Addendum)
It was good to see you today- I will be in touch with your labs asap  We refilled your adderall and ativan today I also increased your wellbutrin to 100 mg twice a day- please let me know how this works for [employer]  Please do think about getting back in with your counselor We will do routine STI screening and also look for any cause of your weight loss   Please try and increase calories, see if you can stop losing weight.  However, I would recommend that you stop drinking sugared soda as this is bad for your teeth and your health   Let's plan to visit in 6 months or sooner if needed    Health Maintenance, Male A healthy lifestyle and preventive care is important for your health and wellness. Ask your health care provider about what schedule of regular examinations is right for you. What should I know about weight and diet? Eat a Healthy Diet  Eat plenty of vegetables, fruits, whole grains, low-fat dairy products, and lean protein.  Do not eat a lot of foods high in solid fats, added sugars, or salt.  Maintain a Healthy Weight Regular exercise can help you achieve or maintain a healthy weight. You should:  Do at least 150 minutes of exercise each week. The exercise should increase your heart rate and make you sweat (moderate-intensity exercise).  Do strength-training exercises at least twice a week. Watch Your Levels of Cholesterol and Blood Lipids  Have your blood tested for lipids and cholesterol every 5 years starting at 27 years of age. If you are at high risk for heart disease, you should start having your blood tested when you are 27 years old. You may need to have your cholesterol levels checked more often if: ? Your lipid or cholesterol levels are high. ? You are older than 27 years of age. ? You are at high risk for heart disease. What should I know about cancer screening? Many types of cancers can be detected early and may often be prevented. Lung Cancer  You should be  screened every year for lung cancer if: ? You are a current smoker who has smoked for at least 30 years. ? You are a former smoker who has quit within the past 15 years.  Talk to your health care provider about your screening options, when you should start screening, and how often you should be screened. Colorectal Cancer  Routine colorectal cancer screening usually begins at 27 years of age and should be repeated every 5-10 years until you are 27 years old. You may need to be screened more often if early forms of precancerous polyps or small growths are found. Your health care provider may recommend screening at an earlier age if you have risk factors for colon cancer.  Your health care provider may recommend using home test kits to check for hidden blood in the stool.  A small camera at the end of a tube can be used to examine your colon (sigmoidoscopy or colonoscopy). This checks for the earliest forms of colorectal cancer. Prostate and Testicular Cancer  Depending on your age and overall health, your health care provider may do certain tests to screen for prostate and testicular cancer.  Talk to your health care provider about any symptoms or concerns you have about testicular or prostate cancer. Skin Cancer  Check your skin from head to toe regularly.  Tell your health care provider about any new moles or changes in moles,  especially if: ? There is a change in a mole's size, shape, or color. ? You have a mole that is larger than a pencil eraser.  Always use sunscreen. Apply sunscreen liberally and repeat throughout the day.  Protect yourself by wearing long sleeves, pants, a wide-brimmed hat, and sunglasses when outside. What should I know about heart disease, diabetes, and high blood pressure?  If you are 95-102 years of age, have your blood pressure checked every 3-5 years. If you are 58 years of age or older, have your blood pressure checked every year. You should have your blood  pressure measured twice-once when you are at a hospital or clinic, and once when you are not at a hospital or clinic. Record the average of the two measurements. To check your blood pressure when you are not at a hospital or clinic, you can use: ? An automated blood pressure machine at a pharmacy. ? A home blood pressure monitor.  Talk to your health care provider about your target blood pressure.  If you are between 39-66 years old, ask your health care provider if you should take aspirin to prevent heart disease.  Have regular diabetes screenings by checking your fasting blood sugar level. ? If you are at a normal weight and have a low risk for diabetes, have this test once every three years after the age of 81. ? If you are overweight and have a high risk for diabetes, consider being tested at a younger age or more often.  A one-time screening for abdominal aortic aneurysm (AAA) by ultrasound is recommended for men aged 65-75 years who are current or former smokers. What should I know about preventing infection? Hepatitis B If you have a higher risk for hepatitis B, you should be screened for this virus. Talk with your health care provider to find out if you are at risk for hepatitis B infection. Hepatitis C Blood testing is recommended for:  Everyone born from [birth year] through 1964.  Anyone with known risk factors for hepatitis C. Sexually Transmitted Diseases (STDs)  You should be screened each year for STDs including gonorrhea and chlamydia if: ? You are sexually active and are younger than 27 years of age. ? You are older than 27 years of age and your health care provider tells you that you are at risk for this type of infection. ? Your sexual activity has changed since you were last screened and you are at an increased risk for chlamydia or gonorrhea. Ask your health care provider if you are at risk.  Talk with your health care provider about whether you are at high risk of being  infected with HIV. Your health care provider may recommend a prescription medicine to help prevent HIV infection. What else can I do?  Schedule regular health, dental, and eye exams.  Stay current with your vaccines (immunizations).  Do not use any tobacco products, such as cigarettes, chewing tobacco, and e-cigarettes. If you need help quitting, ask your health care provider.  Limit alcohol intake to no more than 2 drinks per day. One drink equals 12 ounces of beer, 5 ounces of wine, or 1 ounces of hard liquor.  Do not use street drugs.  Do not share needles.  Ask your health care provider for help if you need support or information about quitting drugs.  Tell your health care provider if you often feel depressed.  Tell your health care provider if you have ever been abused or do not feel  safe at home. This information is not intended to replace advice given to you by your health care provider. Make sure you discuss any questions you have with your health care provider. Document Released: 12/15/2007 Document Revised: 02/15/2016 Document Reviewed: 03/22/2015 Elsevier Interactive Patient Education  2019 [employer]

## 2018-08-21 LAB — C. TRACHOMATIS/N. GONORRHOEAE RNA
C. trachomatis RNA, TMA: NOT DETECTED
N. gonorrhoeae RNA, TMA: NOT DETECTED

## 2018-08-21 LAB — HIV ANTIBODY (ROUTINE TESTING W REFLEX): HIV 1&2 Ab, 4th Generation: NONREACTIVE

## 2018-08-21 LAB — HEPATITIS C ANTIBODY
Hepatitis C Ab: NONREACTIVE
SIGNAL TO CUT-OFF: 0.02 (ref <1.00)

## 2018-08-21 LAB — RPR: RPR Ser Ql: NONREACTIVE

## 2018-08-21 LAB — HEPATITIS B SURFACE ANTIBODY, QUANTITATIVE: Hepatitis B-Post: <5 m[IU]/mL — ABNORMAL LOW (ref >=10)

## 2018-08-21 LAB — HEPATITIS B SURFACE ANTIGEN: Hepatitis B Surface Ag: NONREACTIVE

## 2018-09-16 ENCOUNTER — Other Ambulatory Visit: Payer: Self-pay | Admitting: Family Medicine

## 2018-09-16 DIAGNOSIS — F4010 Social phobia, unspecified: Secondary | ICD-10-CM

## 2018-11-03 DIAGNOSIS — Z79899 Other long term (current) drug therapy: Secondary | ICD-10-CM | POA: Diagnosis not present

## 2018-11-03 DIAGNOSIS — S0031XA Abrasion of nose, initial encounter: Secondary | ICD-10-CM | POA: Diagnosis not present

## 2018-11-03 DIAGNOSIS — F17210 Nicotine dependence, cigarettes, uncomplicated: Secondary | ICD-10-CM | POA: Diagnosis not present

## 2018-11-03 DIAGNOSIS — F99 Mental disorder, not otherwise specified: Secondary | ICD-10-CM | POA: Diagnosis not present

## 2018-11-03 DIAGNOSIS — Z0289 Encounter for other administrative examinations: Secondary | ICD-10-CM | POA: Diagnosis not present

## 2018-11-03 DIAGNOSIS — R000 Tachycardia, unspecified: Secondary | ICD-10-CM | POA: Diagnosis not present

## 2018-11-03 DIAGNOSIS — Z886 Allergy status to analgesic agent status: Secondary | ICD-10-CM | POA: Diagnosis not present

## 2018-11-05 ENCOUNTER — Encounter: Payer: Self-pay | Admitting: Internal Medicine

## 2018-11-05 ENCOUNTER — Ambulatory Visit: Payer: BLUE CROSS/BLUE SHIELD | Admitting: Internal Medicine

## 2018-11-05 ENCOUNTER — Encounter: Payer: Self-pay | Admitting: Family Medicine

## 2018-11-05 ENCOUNTER — Ambulatory Visit (HOSPITAL_BASED_OUTPATIENT_CLINIC_OR_DEPARTMENT_OTHER)
Admission: RE | Admit: 2018-11-05 | Discharge: 2018-11-05 | Disposition: A | Discharge status: Home / Self Care | Payer: BLUE CROSS/BLUE SHIELD | Source: Ambulatory Visit | Attending: Internal Medicine | Admitting: Internal Medicine

## 2018-11-05 ENCOUNTER — Other Ambulatory Visit: Payer: Self-pay

## 2018-11-05 VITALS — BP 154/73 | HR 78 | Temp 98.6°F | Resp 16 | Ht 74.0 in | Wt 184.0 lb

## 2018-11-05 DIAGNOSIS — M25532 Pain in left wrist: Secondary | ICD-10-CM | POA: Diagnosis not present

## 2018-11-05 DIAGNOSIS — S60812A Abrasion of left wrist, initial encounter: Secondary | ICD-10-CM | POA: Diagnosis not present

## 2018-11-05 DIAGNOSIS — S20212A Contusion of left front wall of thorax, initial encounter: Secondary | ICD-10-CM | POA: Diagnosis not present

## 2018-11-05 DIAGNOSIS — S50812A Abrasion of left forearm, initial encounter: Secondary | ICD-10-CM | POA: Diagnosis not present

## 2018-11-05 NOTE — Progress Notes (Signed)
Subjective:    Patient ID: Bob Hodges, male    DOB: 1990/07/10, 28 y.o.   MRN: [id number]  DOS:  11/05/2018 Type of visit - description: Acute visit, in person. On 11/02/2018 he was forcefully handcuffed by police, after the incident he developed pain and swelling at the left forearm. The swelling is better except the radial aspect of the distal forearm He has some numbness at the base of the left thumb. He reports a history of previous fracture on that arm.  Also, has a mild discomfort only with deep inspiration at the left posterior rib cage.   BP Readings from Last 3 Encounters:  11/05/18 (!) 154/73  08/20/18 130/80  02/06/18 136/80     Review of Systems Denies other injuries  Past Medical History:  Diagnosis Date  . ADHD (attention deficit hyperactivity disorder)     No past surgical history on file.  Social History   Socioeconomic History  . Marital status: Single    Spouse name: Not on file  . Number of children: Not on file  . Years of education: Not on file  . Highest education level: Not on file  Occupational History  . Not on file  Social Needs  . Financial resource strain: Not on file  . Food insecurity:    Worry: Not on file    Inability: Not on file  . Transportation needs:    Medical: Not on file    Non-medical: Not on file  Tobacco Use  . Smoking status: Current Every Day Smoker    Packs/day: 0.50    Years: 4.00    Pack years: 2.00    Types: Cigarettes  . Smokeless tobacco: Never Used  Substance and Sexual Activity  . Alcohol use: Yes  . Drug use: No  . Sexual activity: Yes    Partners: Female  Lifestyle  . Physical activity:    Days per week: Not on file    Minutes per session: Not on file  . Stress: Not on file  Relationships  . Social connections:    Talks on phone: Not on file    Gets together: Not on file    Attends religious service: Not on file    Active member of club or organization: Not on file    Attends meetings of  clubs or organizations: Not on file    Relationship status: Not on file  . Intimate partner violence:    Fear of current or ex partner: Not on file    Emotionally abused: Not on file    Physically abused: Not on file    Forced sexual activity: Not on file  Other Topics Concern  . 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  Mother is in town, father is deceased of ?OD.      Allergies as of 11/05/2018      Reactions   Naproxen Other (See Comments)   Tunnel vision, blacked out   Tylenol [acetaminophen] Hives, Shortness Of Breath      Medication List       Accurate as of Nov 05, 2018 11:59 PM. If you have any questions, ask your nurse or doctor.        amphetamine-dextroamphetamine 10 MG tablet Commonly known as:  ADDERALL Take 1 tablet TID prn.   amphetamine-dextroamphetamine 10 MG tablet Commonly known as:  ADDERALL Take 1 tablet (10 mg total) by mouth 3 (three) times daily as needed. Ok  to fill in 60 days   amphetamine-dextroamphetamine 10 MG tablet Commonly known as:  Adderall Take 1 tablet (10 mg total) by mouth 3 (three) times daily as needed.   buPROPion 75 MG tablet Commonly known as:  WELLBUTRIN TAKE 1 TABLET BY MOUTH TWICE A DAY   buPROPion 100 MG 12 hr tablet Commonly known as:  WELLBUTRIN SR TAKE 1 TABLET BY MOUTH TWICE A DAY   ibuprofen 200 MG tablet Commonly known as:  ADVIL Take 200 mg by mouth every 6 (six) hours as needed for pain (PAIN).   LORazepam 1 MG tablet Commonly known as:  ATIVAN Take 1/2 or 1 prior to social occasion as needed for anxiety           Objective:   Physical Exam Pulmonary:    Musculoskeletal:       Arms:    BP (!) 154/73 (BP Location: Right Arm, Patient Position: Sitting, Cuff Size: Small)   Pulse 78   Temp 98.6 F (37 C) (Oral)   Resp 16   Ht 6\' 2"  (1.88 m)   Wt 184 lb (83.5 kg)   SpO2 99%   BMI 23.62 kg/m  General:   Well developed, NAD, BMI noted. HEENT:   Normocephalic . Face symmetric, atraumatic Lungs:  CTA B Normal respiratory effort, no intercostal retractions, no accessory muscle use. Heart: RRR,  no murmur.  No pretibial edema bilaterally MSK: See graphic Left wrist: No deformities, no TTP Left hand: Range of motion normal throughout, unable to see the nailbeds due to---- Good capillary refill in all fingers Skin: Not pale. Not jaundice Neurologic:  alert & oriented X3.  Speech normal, gait appropriate for age and unassisted Psych--  Cognition and judgment appear intact.  Cooperative with normal attention span and concentration.  Behavior appropriate. No anxious or depressed appearing.      Assessment    28 year old male, PMH includes ADHD, anxiety, presents with: Injury, left forearm: Will obtain x-rays, recommend ice for pain, he is allergic to NSAIDs and Tylenol. He has some numbness at the base of the thumb swelling at the forearm, recommend to call me if that is not  resolved in the next few days. Mild chest contusion: Diffuse, mild TTP at the posterior chest, doubt rib fracture, recommend observation.

## 2018-11-05 NOTE — Progress Notes (Signed)
Pre visit review using our clinic review tool, if applicable. No additional management support is needed unless otherwise documented below in the visit note. 

## 2018-11-05 NOTE — Patient Instructions (Addendum)
STOP BY THE FIRST FLOOR:  get the XR   Apply ice to the area of pain twice a day for the next 3 days   Avoid Tylenol or ibuprofen since you are allergic to them  If the numbness at the base of your left thumb is not better in the next week please let us know.

## 2018-11-05 NOTE — Telephone Encounter (Signed)
Scheduled to see Dr. Drue Novel in person today.

## 2018-11-07 ENCOUNTER — Telehealth: Payer: Self-pay | Admitting: *Deleted

## 2018-11-07 NOTE — Telephone Encounter (Signed)
Received request for Medical Records from [facility], [place]; forwarded to HIM Department/Medical Records via fax/SLS

## 2018-11-07 NOTE — Telephone Encounter (Signed)
Received request for Medical Records from [facility], [place]; forwarded to Medical Records via fax/SLS 05/08

## 2018-12-02 ENCOUNTER — Encounter: Payer: Self-pay | Admitting: Family Medicine

## 2018-12-02 DIAGNOSIS — F4010 Social phobia, unspecified: Secondary | ICD-10-CM

## 2018-12-02 DIAGNOSIS — F900 Attention-deficit hyperactivity disorder, predominantly inattentive type: Secondary | ICD-10-CM

## 2018-12-02 MED ORDER — LORAZEPAM 1 MG PO TABS: ORAL_TABLET | ORAL | 2 refills | Status: DC

## 2018-12-02 MED ORDER — AMPHETAMINE-DEXTROAMPHETAMINE 10 MG PO TABS: 10.0000 mg | ORAL_TABLET | Freq: Three times a day (TID) | ORAL | 0 refills | Status: DC | PRN

## 2018-12-02 MED ORDER — BUPROPION HCL ER (SR) 100 MG PO TB12: 100.0000 mg | ORAL_TABLET | Freq: Two times a day (BID) | ORAL | 2 refills | Status: DC

## 2018-12-02 MED ORDER — AMPHETAMINE-DEXTROAMPHETAMINE 10 MG PO TABS: ORAL_TABLET | ORAL | 0 refills | Status: DC

## 2019-01-09 ENCOUNTER — Encounter: Payer: Self-pay | Admitting: Family Medicine

## 2019-02-04 ENCOUNTER — Other Ambulatory Visit: Payer: Self-pay

## 2019-02-04 ENCOUNTER — Encounter (HOSPITAL_COMMUNITY): Payer: Self-pay | Admitting: Emergency Medicine

## 2019-02-04 ENCOUNTER — Ambulatory Visit (HOSPITAL_COMMUNITY)
Admission: EM | Admit: 2019-02-04 | Discharge: 2019-02-04 | Disposition: A | Discharge status: Home / Self Care | Payer: BC Managed Care – PPO | Attending: Emergency Medicine | Admitting: Emergency Medicine

## 2019-02-04 DIAGNOSIS — S41111A Laceration without foreign body of right upper arm, initial encounter: Secondary | ICD-10-CM | POA: Diagnosis not present

## 2019-02-04 DIAGNOSIS — W458XXA Other foreign body or object entering through skin, initial encounter: Secondary | ICD-10-CM

## 2019-02-04 MED ORDER — LIDOCAINE-EPINEPHRINE (PF) 2 %-1:200000 IJ SOLN
INTRAMUSCULAR | Status: AC
  Filled 2019-02-04: qty 20

## 2019-02-04 NOTE — Discharge Instructions (Signed)
Leave this dressing on for the next 24 hours.  Then may remove, and cleanse daily with soap and water.  Keep covered to keep clean.  Take ibuprofen once home.  Ice, elevation also to help with pain.  Return here or to your PCP for removal of 5 horizontal mattress sutures in 10 days.  Return sooner for any increased pain, redness, swelling, or pus drainage.

## 2019-02-04 NOTE — ED Triage Notes (Signed)
Pt here for laceration to right arm today

## 2019-02-04 NOTE — ED Provider Notes (Signed)
[facility]    CSN: [id number] Arrival date & time: 02/04/19  1414      History   Chief Complaint Chief Complaint  Patient presents with  . Laceration    HPI Richardson Doppaylor R Edelstein is a 28 y.o. male.   Richardson Doppaylor R Holtzer presents with complaints of laceration to left antecubital space/upper arm. He was throwing away a pop up tent which had collapsed, as he did so a broken rod accidentally cut his arm. He cleansed the wound and dressed it. Now does have some throbbing pain. No further bleeding. tdap in the last 2-3 years. Full use of right arm. No numbness or tingling. No weakness. Without contributing medical history.      ROS per HPI, negative if not otherwise mentioned.      Past Medical History:  Diagnosis Date  . ADHD (attention deficit hyperactivity disorder)     Patient Active Problem List   Diagnosis Date Noted  . Social anxiety disorder 03/22/2016  . Muscle pain 03/22/2016  . Other seasonal allergic rhinitis 03/22/2016  . Tobacco abuse 02/07/2012  . ACNE VULGARIS 06/05/2010  . ADHD 04/13/2008    History reviewed. No pertinent surgical history.     Home Medications    Prior to Admission medications   Medication Sig Start Date End Date Taking? Authorizing Provider  amphetamine-dextroamphetamine (ADDERALL) 10 MG tablet Take 1 tablet TID prn. 12/02/18   Copland, Gwenlyn FoundJessica C, MD  amphetamine-dextroamphetamine (ADDERALL) 10 MG tablet Take 1 tablet (10 mg total) by mouth 3 (three) times daily as needed. Ok to fill in 60 days 12/02/18   Copland, Gwenlyn FoundJessica C, MD  amphetamine-dextroamphetamine (ADDERALL) 10 MG tablet Take 1 tablet (10 mg total) by mouth 3 (three) times daily as needed. 12/02/18   Copland, Gwenlyn FoundJessica C, MD  buPROPion (WELLBUTRIN SR) 100 MG 12 hr tablet Take 1 tablet (100 mg total) by mouth 2 (two) times daily. 12/02/18   Copland, Gwenlyn FoundJessica C, MD  buPROPion (WELLBUTRIN) 75 MG tablet TAKE 1 TABLET BY MOUTH TWICE A DAY 12/24/17   Copland, Gwenlyn FoundJessica C, MD   ibuprofen (ADVIL,MOTRIN) 200 MG tablet Take 200 mg by mouth every 6 (six) hours as needed for pain (PAIN).    [provider]  LORazepam (ATIVAN) 1 MG tablet Take 1/2 or 1 prior to social occasion as needed for anxiety 12/02/18   Copland, Gwenlyn FoundJessica C, MD    Family History Family History  Problem Relation Age of Onset  . Lupus Mother     Social History Social History   Tobacco Use  . Smoking status: Current Every Day Smoker    Packs/day: 0.50    Years: 4.00    Pack years: 2.00    Types: Cigarettes  . Smokeless tobacco: Never Used  Substance Use Topics  . Alcohol use: Yes  . Drug use: No     Allergies   Naproxen and Tylenol [acetaminophen]   Review of Systems Review of Systems   Physical Exam Triage Vital Signs ED Triage Vitals  Enc Vitals Group     BP 02/04/19 1444 (!) 146/81     Pulse Rate 02/04/19 1444 83     Resp 02/04/19 1444 18     Temp 02/04/19 1444 98.5 F (36.9 C)     Temp Source 02/04/19 1444 Oral     SpO2 02/04/19 1444 100 %     Weight --      Height --      Head Circumference --  Peak Flow --      Pain Score 02/04/19 1445 4     Pain Loc --      Pain Edu? --      Excl. in GC? --    No data found.  Updated Vital Signs BP (!) 146/81 (BP Location: Left Arm)   Pulse 83   Temp 98.5 F (36.9 C) (Oral)   Resp 18   SpO2 100%   Visual Acuity Right Eye Distance:   Left Eye Distance:   Bilateral Distance:    Right Eye Near:   Left Eye Near:    Bilateral Near:     Physical Exam Constitutional:      Appearance: He is well-developed.  Cardiovascular:     Rate and Rhythm: Normal rate.  Pulmonary:     Effort: Pulmonary effort is normal.  Musculoskeletal:     Right shoulder: He exhibits laceration.       Arms:     Comments: Approximately 2cm laceration with approximately 0.5cm in separation, approximately 4mm deep; no underlying structure involvement; no active bleeding; full use of upper arm; cap refill < 2 seconds    Skin:     General: Skin is warm and dry.  Neurological:     Mental Status: He is alert and oriented to person, place, and time.      UC Treatments / Results  Labs (all labs ordered are listed, but only abnormal results are displayed) Labs Reviewed - No data to display  EKG   Radiology No results found.  Procedures Laceration Repair  Date/Time: 02/04/2019 3:55 PM Performed by: Georgetta HaberBurky, Alwin Lanigan B, NP Authorized by: Georgetta HaberBurky, Reginia Battie B, NP   Consent:    Consent obtained:  Verbal   Consent given by:  Patient   Risks discussed:  Infection, pain, poor cosmetic result, poor wound healing and need for additional repair   Alternatives discussed:  No treatment, observation and referral Anesthesia (see MAR for exact dosages):    Anesthesia method:  Local infiltration   Local anesthetic:  Lidocaine 2% WITH epi Laceration details:    Location:  Shoulder/arm   Shoulder/arm location:  R elbow (right antecubital )   Length (cm):  2   Depth (mm):  4 Repair type:    Repair type:  Simple Pre-procedure details:    Preparation:  Patient was prepped and draped in usual sterile fashion Exploration:    Hemostasis achieved with:  Epinephrine   Wound exploration: wound explored through full range of motion and entire depth of wound probed and visualized     Wound extent: no foreign bodies/material noted, no muscle damage noted, no nerve damage noted, no tendon damage noted, no underlying fracture noted and no vascular damage noted     Contaminated: no   Treatment:    Area cleansed with:  Betadine and Shur-Clens   Amount of cleaning:  Standard Skin repair:    Repair method:  Sutures   Suture size:  5-0   Suture material:  Nylon   Suture technique:  Horizontal mattress   Number of sutures:  5 Approximation:    Approximation:  Close Post-procedure details:    Dressing:  Bulky dressing   Patient tolerance of procedure:  Tolerated well, no immediate complications   (including critical care time)   Medications Ordered in UC Medications  lidocaine-EPINEPHrine (XYLOCAINE W/EPI) 2 %-1:200000 (PF) injection (has no administration in time range)    Initial Impression / Assessment and Plan / UC Course  I have reviewed the  triage vital signs and the nursing notes.  Pertinent labs & imaging results that were available during my care of the patient were reviewed by me and considered in my medical decision making (see chart for details).     Fairly wide gap to lac at the [facility] of right arm, closed with 5 horizontal mattress with good approximation. Wound care discussed. Return precautions provided. Patient verbalized understanding and agreeable to plan.   Final Clinical Impressions(s) / UC Diagnoses   Final diagnoses:  Laceration of right upper extremity, initial encounter     Discharge Instructions     Leave this dressing on for the next 24 hours.  Then may remove, and cleanse daily with soap and water.  Keep covered to keep clean.  Take ibuprofen once home.  Ice, elevation also to help with pain.  Return here or to your PCP for removal of 5 horizontal mattress sutures in 10 days.  Return sooner for any increased pain, redness, swelling, or pus drainage.    ED Prescriptions    None     Controlled Substance Prescriptions [place] Controlled Substance Registry consulted? Not Applicable   Zigmund Gottron, NP 02/04/19 1657

## 2019-02-13 ENCOUNTER — Encounter: Payer: Self-pay | Admitting: Family Medicine

## 2019-02-16 NOTE — Patient Instructions (Addendum)
It was great to see you today Please be sure to have a flu shot this fall We took your sutures out today-  Let me know if any concerns or sign of infection I will see if I can help with your recent radiology bill!   Refilled adderall and lorazepam for 3 months today We can increase your wellbutrin to 150 twice a day   Please let me know if you are not doing ok- otherwise recheck in 6 months

## 2019-02-16 NOTE — Progress Notes (Signed)
[facility] [address], [address] [place], [place] [address] [phone] [phone]  Date:  02/18/2019   Name:  Bob Hodges   DOB:  12/28/1990   MRN:  [id number]  PCP:  Pearline Cablesopland, Jessica C, MD    Chief Complaint: Anxiety and ADHD   History of Present Illness:  Bob Hodges is a 28 y.o. very pleasant male patient who presents with the following:  Here today for a periodic follow-up visit History of social anxiety, ADHD Last seen by myself in February-at time he had recently been through a rough patch, had been living in a hotel for some time.  He had cut down on alcohol  At our last visit we increased his Wellbutrin due to more depression symptoms He was seen in the ER on August 5 with a laceration to his arm, which is repaired with sutures. Would like to have the sutures removed today The laceration seems to have healed well   Due for flu shot- declines today tdap is utd  We did labs in February  Currently he is taking Adderall 10 3 times daily Wellbutrin SR 100 twice daily-  Ativan 1 mg 20/month  Overall his life is looking up.  He does admit he is drinking alcohol again Continues have some symptoms of depression, he wonders about increasing Wellbutrin dose  Wt Readings from Last 3 Encounters:  02/18/19 196 lb (88.9 kg)  11/05/18 184 lb (83.5 kg)  08/20/18 174 lb (78.9 kg)   Pulse Readings from Last 3 Encounters:  02/18/19 (!) 108  02/04/19 83  11/05/18 78     02/02/2019  2   12/02/2018  Lorazepam 1 MG Tablet  20.00 20 Je Cop   [id number]   [place] (2868)   2  1.00 LME  Comm Ins   [employer]  02/02/2019  2   12/02/2018  Dextroamp-Amphetamin 10 MG Tab  90.00 30 Je Cop   [id number]   [place] (2868)   0   Comm Ins   [place]  01/04/2019  2   12/02/2018  Lorazepam 1 MG Tablet  20.00 20 Je Cop   [id number]   [place] (2868)   1  1.00 LME  Comm Ins   [place]  01/02/2019  2   12/02/2018  Dextroamp-Amphetamin 10 MG Tab  90.00 30 Je Cop   [id number]   [place]  (2868)   0   Comm Ins   [place]  12/02/2018  2   12/02/2018  Dextroamp-Amphetamin 10 MG Tab  90.00 30 Je Cop   [id number]   [place] (2868)   0   Comm Ins   [place]  12/02/2018  2   12/02/2018  Lorazepam 1 MG Tablet  20.00 20 Je Cop   [id number]   [place] (2868)   0  1.00 LME  Comm Ins   [place]  11/02/2018  2   08/20/2018  Dextroamp-Amphetamin 10 MG Tab  90.00 30 Je Cop   [id number]   [place] (3235)   0   Comm Ins   [place]  11/02/2018  2   08/20/2018  Lorazepam 1 MG Tablet  20.00 20 Je Cop   [id number]   Nor (3235)   2  1.00 LME      Patient Active Problem List   Diagnosis Date Noted  . Social anxiety disorder 03/22/2016  . Muscle pain 03/22/2016  . Other seasonal allergic rhinitis 03/22/2016  . Tobacco abuse 02/07/2012  . ACNE  VULGARIS 06/05/2010  . ADHD 04/13/2008    Past Medical History:  Diagnosis Date  . ADHD (attention deficit hyperactivity disorder)     No past surgical history on file.  Social History   Tobacco Use  . Smoking status: Current Every Day Smoker    Packs/day: 0.50    Years: 4.00    Pack years: 2.00    Types: Cigarettes  . Smokeless tobacco: Never Used  Substance Use Topics  . Alcohol use: Yes  . Drug use: No    Family History  Problem Relation Age of Onset  . Lupus Mother     Allergies  Allergen Reactions  . Naproxen Other (See Comments)    Tunnel vision, blacked out  . Tylenol [Acetaminophen] Hives and Shortness Of Breath    Medication list has been reviewed and updated.  Current Outpatient Medications on File Prior to Visit  Medication Sig Dispense Refill  . amphetamine-dextroamphetamine (ADDERALL) 10 MG tablet Take 1 tablet TID prn. 90 tablet 0  . amphetamine-dextroamphetamine (ADDERALL) 10 MG tablet Take 1 tablet (10 mg total) by mouth 3 (three) times daily as needed. Ok to fill in 60 days 90 tablet 0  . amphetamine-dextroamphetamine (ADDERALL) 10 MG tablet Take 1 tablet (10 mg total) by mouth 3 (three) times daily as needed. 90 tablet 0  . buPROPion (WELLBUTRIN SR) 100 MG  12 hr tablet Take 1 tablet (100 mg total) by mouth 2 (two) times daily. 180 tablet 2  . ibuprofen (ADVIL,MOTRIN) 200 MG tablet Take 200 mg by mouth every 6 (six) hours as needed for pain (PAIN).    . LORazepam (ATIVAN) 1 MG tablet Take 1/2 or 1 prior to social occasion as needed for anxiety 20 tablet 2   No current facility-administered medications on file prior to visit.     Review of Systems:  As per HPI- otherwise negative. No fever or chills, no chest pain or shortness of breath  Physical Examination: Vitals:   02/18/19 1554  BP: 140/80  Pulse: (!) 108  Resp: 16  Temp: 98.4 F (36.9 C)  SpO2: 98%   Vitals:   02/18/19 1554  Weight: 196 lb (88.9 kg)  Height: 6\' 2"  (1.88 m)   Body mass index is 25.16 kg/m. Ideal Body Weight: Weight in (lb) to have BMI = 25: 194.3  GEN: WDWN, NAD, Non-toxic, A & O x 3, looks well, normal weight HEENT: Atraumatic, Normocephalic. Neck supple. No masses, No LAD. Ears and Nose: No external deformity. CV: RRR, No M/G/R. No JVD. No thrill. No extra heart sounds. PULM: CTA B, no wheezes, crackles, rhonchi. No retractions. No resp. distress. No accessory muscle use. EXTR: No c/c/e NEURO Normal gait.  PSYCH: Normally interactive. Conversant. Not depressed or anxious appearing.  Calm demeanor.  Removed 5 HM sutures from the medial aspect of the right elbow Wound is well-healed, no sign of infection  Assessment and Plan:   ICD-10-CM   1. Visit for suture removal  Z48.02   2. Social anxiety disorder  F40.10 buPROPion (WELLBUTRIN SR) 150 MG 12 hr tablet    LORazepam (ATIVAN) 1 MG tablet  3. ADHD (attention deficit hyperactivity disorder), inattentive type  F90.0 amphetamine-dextroamphetamine (ADDERALL) 10 MG tablet    amphetamine-dextroamphetamine (ADDERALL) 10 MG tablet    amphetamine-dextroamphetamine (ADDERALL) 10 MG tablet   Bob Hodges is here today for suture removal up in the right arm.  Removed all sutures, wound is well-healed.  He will let  us know if any evidence of infection Refilled  his Lorazepam and Adderall for 3 months Currently on Wellbutrin SR 100 twice daily.  Will increase to 150 twice daily  He will keep me posted how he is feeling, plan to recheck in about 6 months Noted to be slightly tachycardic today.  Patient is feeling anxious  Follow-up: No follow-ups on file.  Meds ordered this encounter  Medications  . buPROPion (WELLBUTRIN SR) 150 MG 12 hr tablet    Sig: Take 1 tablet (150 mg total) by mouth 2 (two) times daily.    Dispense:  60 tablet    Refill:  3  . amphetamine-dextroamphetamine (ADDERALL) 10 MG tablet    Sig: Take 1 tablet TID prn.    Dispense:  90 tablet    Refill:  0  . amphetamine-dextroamphetamine (ADDERALL) 10 MG tablet    Sig: Take 1 tablet (10 mg total) by mouth 3 (three) times daily as needed. Ok to fill in 60 days    Dispense:  90 tablet    Refill:  0  . amphetamine-dextroamphetamine (ADDERALL) 10 MG tablet    Sig: Take 1 tablet (10 mg total) by mouth 3 (three) times daily as needed.    Dispense:  90 tablet    Refill:  0    Ok to fill in 30 days  . LORazepam (ATIVAN) 1 MG tablet    Sig: Take 1/2 or 1 prior to social occasion as needed for anxiety    Dispense:  20 tablet    Refill:  2   No orders of the defined types were placed in this encounter.   @SIGN @    Signed Lamar Blinks, MD

## 2019-02-17 ENCOUNTER — Other Ambulatory Visit: Payer: Self-pay

## 2019-02-18 ENCOUNTER — Encounter: Payer: Self-pay | Admitting: Family Medicine

## 2019-02-18 ENCOUNTER — Ambulatory Visit: Payer: BC Managed Care – PPO | Admitting: Family Medicine

## 2019-02-18 VITALS — BP 140/80 | HR 108 | Temp 98.4°F | Resp 16 | Ht 74.0 in | Wt 196.0 lb

## 2019-02-18 DIAGNOSIS — Z4802 Encounter for removal of sutures: Secondary | ICD-10-CM

## 2019-02-18 DIAGNOSIS — F4010 Social phobia, unspecified: Secondary | ICD-10-CM | POA: Diagnosis not present

## 2019-02-18 DIAGNOSIS — F900 Attention-deficit hyperactivity disorder, predominantly inattentive type: Secondary | ICD-10-CM

## 2019-02-18 MED ORDER — AMPHETAMINE-DEXTROAMPHETAMINE 10 MG PO TABS: ORAL_TABLET | ORAL | 0 refills | Status: DC

## 2019-02-18 MED ORDER — LORAZEPAM 1 MG PO TABS: ORAL_TABLET | ORAL | 2 refills | Status: DC

## 2019-02-18 MED ORDER — BUPROPION HCL ER (SR) 150 MG PO TB12: 150.0000 mg | ORAL_TABLET | Freq: Two times a day (BID) | ORAL | 3 refills | Status: DC

## 2019-02-18 MED ORDER — AMPHETAMINE-DEXTROAMPHETAMINE 10 MG PO TABS: 10.0000 mg | ORAL_TABLET | Freq: Three times a day (TID) | ORAL | 0 refills | Status: DC | PRN

## 2019-03-25 ENCOUNTER — Other Ambulatory Visit: Payer: Self-pay | Admitting: Family Medicine

## 2019-03-25 DIAGNOSIS — F4010 Social phobia, unspecified: Secondary | ICD-10-CM

## 2019-05-14 ENCOUNTER — Other Ambulatory Visit: Payer: Self-pay

## 2019-05-14 ENCOUNTER — Emergency Department
Admission: EM | Admit: 2019-05-14 | Discharge: 2019-05-14 | Discharge status: Absent without leave | Payer: BC Managed Care – PPO | Source: Home / Self Care

## 2019-05-14 DIAGNOSIS — Z20822 Contact with and (suspected) exposure to covid-19: Secondary | ICD-10-CM

## 2019-05-14 DIAGNOSIS — Z20828 Contact with and (suspected) exposure to other viral communicable diseases: Secondary | ICD-10-CM

## 2019-05-30 ENCOUNTER — Encounter: Payer: Self-pay | Admitting: Family Medicine

## 2019-05-30 ENCOUNTER — Other Ambulatory Visit: Payer: Self-pay | Admitting: Family Medicine

## 2019-05-30 DIAGNOSIS — F4010 Social phobia, unspecified: Secondary | ICD-10-CM

## 2019-05-30 DIAGNOSIS — F900 Attention-deficit hyperactivity disorder, predominantly inattentive type: Secondary | ICD-10-CM

## 2019-05-30 MED ORDER — AMPHETAMINE-DEXTROAMPHETAMINE 10 MG PO TABS: ORAL_TABLET | ORAL | 0 refills | Status: DC

## 2019-05-30 MED ORDER — AMPHETAMINE-DEXTROAMPHETAMINE 10 MG PO TABS: 10.0000 mg | ORAL_TABLET | Freq: Three times a day (TID) | ORAL | 0 refills | Status: DC | PRN

## 2019-06-01 NOTE — Telephone Encounter (Signed)
RF request for Ativan  LOV: 08/20/2018 Next ov: Not scheduled  Last written: 02/18/2019 #20 x2 refills   Please advise

## 2019-06-02 DIAGNOSIS — A09 Infectious gastroenteritis and colitis, unspecified: Secondary | ICD-10-CM | POA: Diagnosis not present

## 2019-06-02 DIAGNOSIS — R112 Nausea with vomiting, unspecified: Secondary | ICD-10-CM | POA: Diagnosis not present

## 2019-06-22 ENCOUNTER — Other Ambulatory Visit: Payer: Self-pay | Admitting: Family Medicine

## 2019-06-22 DIAGNOSIS — F4010 Social phobia, unspecified: Secondary | ICD-10-CM

## 2019-09-03 NOTE — Progress Notes (Addendum)
[facility] at [facility] [address], [address] [place], [place] [address] [phone]  Date:  09/07/2019   Name:  Bob Hodges   DOB:  09-27-1990   MRN:  [id number]  PCP:  Pearline Cables, MD    Chief Complaint: Annual Exam (mold exposure lab work?)   History of Present Illness:  Bob Hodges is a 29 y.o. very pleasant male patient who presents with the following:  Here today for periodic follow-up visit- he needs labs and a form completed for his insurance.  It has a questionnaire for him, and he also needs to turn in lab work Patient with history of social anxiety, ADHD Last seen by myself in August for medication follow-up and suture removal We refilled his lorazepam and Adderall, increased Wellbutrin dose as he was having more difficulty with depression  Blood count done in February 2020, otherwise no recent labs Tetanus is up-to-date  He stopped wellbutrin as it just did not help him- however he was able to stop drinking with this medication.  He is continue to avoid alcohol for the last several months, he notes it was not good for his mental health  He is taking adderall 10 TID; he would like to try increasing to 40 daily- 20 BID-if possible.  He is currently taking 10 mg in the morning and 20 at lunch, notes that the 10 mg in the morning does not seem to be helping control his ADD symptoms  He does continue to suffer from mostly social anxiety.  He has so far tried BuSpar and he thinks either Cymbalta or Celexa.  Wonders about trying another medication  He has noted a right shoulder problem for about 6 months His job is quite physical ago he cannot recall any particular injury His shoulder does hurt him a fair amount, especially when he uses a shovel-he works as a Administrator  He had covid 19 in November -he has mostly recovered, but notes that he still may get short of breath with exercise at work.  He wonders about  getting a chest film to make sure all is well  No fever or chills He does feel that he likely has allergies, and that he may be getting exposed to mold at his apartment.  He is using Zyrtec and Flonase right now, this mostly controls his symptoms though he may get some postnasal drip He wonders if there is a lab test to check for mold exposure, I advised him that per my knowledge there is not.  07/30/2019  2   05/30/2019  Dextroamp-Amphetamin 10 MG Tab  90.00  30 Je Cop   [id number]   Nor (2868)   0   Comm Ins   [place]  07/13/2019  2   06/01/2019  Lorazepam 1 MG Tablet  20.00  20 Je Cop   [id number]   [place] (2868)   1  1.00 LME  Comm Ins   [place]  06/29/2019  2   05/30/2019  Dextroamp-Amphetamin 10 MG Tab  90.00  30 Je Cop   [id number]   [place] (2868)   0   Comm Ins   [place]  06/01/2019  2   06/01/2019  Lorazepam 1 MG Tablet  20.00  20 Je Cop   [id number]   [place] (2868)   0  1.00 LME  Comm Ins   [place]  05/30/2019  2   05/30/2019  Dextroamp-Amphetamin 10 MG Tab  90.00  [place]   [id number]   [place] (2868)   0   Comm Ins   [place]  04/28/2019  2   02/18/2019  Dextroamp-Amphetamin 10 MG Tab  90.00  30 Je Cop   [id number]   [place] (2868)   0   Comm Ins   [place]  04/26/2019  2   02/18/2019  Lorazepam 1 MG Tablet  20.00  20 Je Cop   [id number]   [place] (2868)   2  1.00 LME  Comm Ins   [place]  04/01/2019  2   02/18/2019  Lorazepam 1 MG Tablet  20.00  20 Je Cop   [id number]   [place] (2868)   1  1.00 LME  Comm Ins   [place]  03/30/2019  2   02/18/2019  Dextroamp-Amphetamin 10 MG Tab  90.00  30 Je Cop   [id number]   [place] (2868)   0   Comm Ins   [place]  03/03/2019  2   02/18/2019  Dextroamp-Amphetamin 10 MG Tab  90.00  30 Je Cop   [id number]   [place] (2868)   0   Comm Ins   [place]  02/19/2019  2   02/18/2019  Lorazepam 1 MG Tablet  20.00  20 Je Cop   [id number]   [place] (2868)   0  1.00 LME  Comm Ins   [place]  02/02/2019  2   12/02/2018  Dextroamp-Amphetamin 10 MG Tab  90.00  30 Je Cop   [id number]   [place] (2868)   0   Comm Ins   [place]  02/02/2019  2   12/02/2018  Lorazepam 1 MG Tablet  20.00  20 Je Cop    [id number]   [place] (2868)   2  1.00 LME  Comm Ins   [place]  01/04/2019  2   12/02/2018  Lorazepam 1 MG Tablet  20.00  20 Je Cop   [id number]   [place] (2868)   1  1.00 LME       Patient Active Problem List   Diagnosis Date Noted  . Social anxiety disorder 03/22/2016  . Muscle pain 03/22/2016  . Other seasonal allergic rhinitis 03/22/2016  . Tobacco abuse 02/07/2012  . ACNE VULGARIS 06/05/2010  . ADHD 04/13/2008    Past Medical History:  Diagnosis Date  . ADHD (attention deficit hyperactivity disorder)     No past surgical history on file.  Social History   Tobacco Use  . Smoking status: Current Every Day Smoker    Packs/day: 0.50    Years: 4.00    Pack years: 2.00    Types: Cigarettes  . Smokeless tobacco: Never Used  Substance Use Topics  . Alcohol use: Yes  . Drug use: No    Family History  Problem Relation Age of Onset  . Lupus Mother     Allergies  Allergen Reactions  . Naproxen Other (See Comments)    Tunnel vision, blacked out  . Tylenol [Acetaminophen] Hives and Shortness Of Breath    Medication list has been reviewed and updated.  Current Outpatient Medications on File Prior to Visit  Medication Sig Dispense Refill  . amphetamine-dextroamphetamine (ADDERALL) 10 MG tablet Take 1 tablet (10 mg total) by mouth 3 (three) times daily as needed. 90 tablet 0  . amphetamine-dextroamphetamine (ADDERALL) 10 MG tablet Take 1 tablet (10 mg total) by mouth 3 (three) times daily as needed. Ok to fill in 60 days 90 tablet 0  . amphetamine-dextroamphetamine (ADDERALL) 10 MG tablet Take  1 tablet TID prn. 90 tablet 0  . ibuprofen (ADVIL,MOTRIN) 200 MG tablet Take 200 mg by mouth every 6 (six) hours as needed for pain (PAIN).    . LORazepam (ATIVAN) 1 MG tablet TAKE 1/2 OR 1 TABLET BY MOUTH PRIOR TO SOCIAL OCCASION AS NEEDED FOR ANXIETY 20 tablet 2  . buPROPion (WELLBUTRIN SR) 150 MG 12 hr tablet TAKE 1 TABLET BY MOUTH TWICE A DAY (Patient not taking: Reported on 09/07/2019) 180 tablet 0    No current facility-administered medications on file prior to visit.    Review of Systems:  As per HPI- otherwise negative.   Physical Examination: Vitals:   09/07/19 1357  BP: 123/75  Pulse: 97  Resp: 16  Temp: 97.7 F (36.5 C)  SpO2: 99%   Vitals:   09/07/19 1357  Weight: 183 lb (83 kg)  Height: 6\' 2"  (1.88 m)   Body mass index is 23.5 kg/m. Ideal Body Weight: Weight in (lb) to have BMI = 25: 194.3  GEN: no acute distress.  Normal weight, looks well HEENT: Atraumatic, Normocephalic.   Bilateral TM wnl, oropharynx normal.  PEERL,EOMI.   Ears and Nose: No external deformity. CV: RRR, No M/G/R. No JVD. No thrill. No extra heart sounds. PULM: CTA B, no wheezes, crackles, rhonchi. No retractions. No resp. distress. No accessory muscle use. ABD: S, NT, ND, +BS. No rebound. No HSM. EXTR: No c/c/e PSYCH: Normally interactive. Conversant.  Right shoulder-rotator cuff tendinitis provocative maneuvers are not bothersome.  His pain seems more over the medial border of the scapula.  No swelling, redness, or impairment of range of motion   Assessment and Plan: Screening for deficiency anemia - Plan: CBC  Screening for hyperlipidemia - Plan: Lipid panel  Screening for diabetes mellitus - Plan: Comprehensive metabolic panel, Hemoglobin A1c  History of COVID-19 - Plan: DG Chest 2 View  ADHD (attention deficit hyperactivity disorder), inattentive type - Plan: amphetamine-dextroamphetamine (ADDERALL) 20 MG tablet, amphetamine-dextroamphetamine (ADDERALL) 20 MG tablet, amphetamine-dextroamphetamine (ADDERALL) 20 MG tablet  Social anxiety disorder - Plan: FLUoxetine (PROZAC) 20 MG capsule  PND (post-nasal drip) - Plan: ipratropium (ATROVENT) 0.03 % nasal spray  Chronic right shoulder pain - Plan: Ambulatory referral to Orthopedic Surgery  Here today with a few concerns Labs today for a work form Will have him try fluoxetine for anxiety Will try increasing his Adderall to 20  mg twice a day-advised him not to try this increase and addition of fluoxetine at the same time.  He states understanding and know if any side effects from either drug, and how he responds Discussed mold exposure.  I suggested trying humidifier to reduce moisture at home, can also try Atrovent nasal for postnasal drainage symptoms I ordered an outpatient chest x-ray to follow-up on lingering symptoms of COVID-19 infection a few months ago Referral to orthopedics for right shoulder problem Moderate medical decision making today  This visit occurred during the SARS-CoV-2 public health emergency.  Safety protocols were in place, including screening questions prior to the visit, additional usage of staff PPE, and extensive cleaning of exam room while observing appropriate contact time as indicated for disinfecting solutions.    Signed , MD\  Received his labs 3/9-   Results for orders placed or performed in visit on 09/07/19  CBC  Result Value Ref Range   WBC 11.0 (H) 4.0 - 10.5 K/uL   RBC 4.54 4.22 - 5.81 Mil/uL   Platelets 265.0 150.0 - 400.0 K/uL   Hemoglobin 14.1 13.0 -  17.0 g/dL   HCT 62.8 63.8 - 17.7 %   MCV 93.6 78.0 - 100.0 fl   MCHC 33.2 30.0 - 36.0 g/dL   RDW 11.6 57.9 - 03.8 %  Comprehensive metabolic panel  Result Value Ref Range   Sodium 140 135 - 145 mEq/L   Potassium 3.6 3.5 - 5.1 mEq/L   Chloride 102 96 - 112 mEq/L   CO2 31 19 - 32 mEq/L   Glucose, Bld 82 70 - 99 mg/dL   BUN 10 6 - 23 mg/dL   Creatinine, Ser 3.33 0.40 - 1.50 mg/dL   Total Bilirubin 0.4 0.2 - 1.2 mg/dL   Alkaline Phosphatase 61 39 - 117 U/L   AST 16 0 - 37 U/L   ALT 21 0 - 53 U/L   Total Protein 7.1 6.0 - 8.3 g/dL   Albumin 4.8 3.5 - 5.2 g/dL   GFR 83.29 >19.16 mL/min   Calcium 9.3 8.4 - 10.5 mg/dL  Hemoglobin O0A  Result Value Ref Range   Hgb A1c MFr Bld 5.2 4.6 - 6.5 %  Lipid panel  Result Value Ref Range   Cholesterol 202 (H) 0 - 200 mg/dL   Triglycerides 00.4 0.0 - 149.0  mg/dL   HDL 59.97 >74.14 mg/dL   VLDL 23.9 0.0 - 53.2 mg/dL   LDL Cholesterol 023 (H) 0 - 99 mg/dL   Total CHOL/HDL Ratio 4    NonHDL 148.31

## 2019-09-04 ENCOUNTER — Other Ambulatory Visit: Payer: Self-pay

## 2019-09-07 ENCOUNTER — Other Ambulatory Visit: Payer: Self-pay

## 2019-09-07 ENCOUNTER — Encounter: Payer: Self-pay | Admitting: Family Medicine

## 2019-09-07 ENCOUNTER — Ambulatory Visit (INDEPENDENT_AMBULATORY_CARE_PROVIDER_SITE_OTHER): Payer: BC Managed Care – PPO | Admitting: Family Medicine

## 2019-09-07 VITALS — BP 123/75 | HR 97 | Temp 97.7°F | Resp 16 | Ht 74.0 in | Wt 183.0 lb

## 2019-09-07 DIAGNOSIS — Z131 Encounter for screening for diabetes mellitus: Secondary | ICD-10-CM

## 2019-09-07 DIAGNOSIS — G8929 Other chronic pain: Secondary | ICD-10-CM

## 2019-09-07 DIAGNOSIS — Z13220 Encounter for screening for lipoid disorders: Secondary | ICD-10-CM | POA: Diagnosis not present

## 2019-09-07 DIAGNOSIS — Z8616 Personal history of COVID-19: Secondary | ICD-10-CM | POA: Diagnosis not present

## 2019-09-07 DIAGNOSIS — R0982 Postnasal drip: Secondary | ICD-10-CM

## 2019-09-07 DIAGNOSIS — M25511 Pain in right shoulder: Secondary | ICD-10-CM

## 2019-09-07 DIAGNOSIS — Z130 Encounter for screening for diseases of the blood and blood-forming organs and certain disorders involving the immune mechanism: Secondary | ICD-10-CM

## 2019-09-07 DIAGNOSIS — F4010 Social phobia, unspecified: Secondary | ICD-10-CM

## 2019-09-07 DIAGNOSIS — F900 Attention-deficit hyperactivity disorder, predominantly inattentive type: Secondary | ICD-10-CM

## 2019-09-07 MED ORDER — AMPHETAMINE-DEXTROAMPHETAMINE 20 MG PO TABS: 20.0000 mg | ORAL_TABLET | Freq: Two times a day (BID) | ORAL | 0 refills | Status: DC

## 2019-09-07 MED ORDER — FLUOXETINE HCL 20 MG PO CAPS: 20.0000 mg | ORAL_CAPSULE | Freq: Every day | ORAL | 3 refills | Status: DC

## 2019-09-07 MED ORDER — IPRATROPIUM BROMIDE 0.03 % NA SOLN: 2.0000 | Freq: Four times a day (QID) | NASAL | 12 refills | Status: AC

## 2019-09-07 NOTE — Patient Instructions (Signed)
Good to see you as always! Let me know how you do on the adderall 20 mg twice a day; if this makes you anxious we can decrease Will also try fluoxetine (20 mg daily) for anxiety I ordered a chest film for you to have done at [facility] at your convenience Will also refer you to orthopedics- Dr Dion Saucier- to check on your shoulder

## 2019-09-08 ENCOUNTER — Encounter: Payer: Self-pay | Admitting: Family Medicine

## 2019-09-08 LAB — HEMOGLOBIN A1C: Hgb A1c MFr Bld: 5.2 % (ref 4.6–6.5)

## 2019-09-08 LAB — LIPID PANEL
Cholesterol: 202 mg/dL — ABNORMAL HIGH (ref 0–200)
HDL: 54 mg/dL (ref >39.00)
LDL Cholesterol: 132 mg/dL — ABNORMAL HIGH (ref 0–99)
NonHDL: 148.31
Total CHOL/HDL Ratio: 4
Triglycerides: 83 mg/dL (ref 0.0–149.0)
VLDL: 16.6 mg/dL (ref 0.0–40.0)

## 2019-09-08 LAB — CBC
HCT: 42.5 % (ref 39.0–52.0)
Hemoglobin: 14.1 g/dL (ref 13.0–17.0)
MCHC: 33.2 g/dL (ref 30.0–36.0)
MCV: 93.6 fl (ref 78.0–100.0)
Platelets: 265 10*3/uL (ref 150.0–400.0)
RBC: 4.54 Mil/uL (ref 4.22–5.81)
RDW: 13.7 % (ref 11.5–15.5)
WBC: 11 10*3/uL — ABNORMAL HIGH (ref 4.0–10.5)

## 2019-09-08 LAB — COMPREHENSIVE METABOLIC PANEL
ALT: 21 U/L (ref 0–53)
AST: 16 U/L (ref 0–37)
Albumin: 4.8 g/dL (ref 3.5–5.2)
Alkaline Phosphatase: 61 U/L (ref 39–117)
BUN: 10 mg/dL (ref 6–23)
CO2: 31 mEq/L (ref 19–32)
Calcium: 9.3 mg/dL (ref 8.4–10.5)
Chloride: 102 mEq/L (ref 96–112)
Creatinine, Ser: 0.99 mg/dL (ref 0.40–1.50)
GFR: 90.18 mL/min (ref >60.00)
Glucose, Bld: 82 mg/dL (ref 70–99)
Potassium: 3.6 mEq/L (ref 3.5–5.1)
Sodium: 140 mEq/L (ref 135–145)
Total Bilirubin: 0.4 mg/dL (ref 0.2–1.2)
Total Protein: 7.1 g/dL (ref 6.0–8.3)

## 2019-09-11 DIAGNOSIS — M25511 Pain in right shoulder: Secondary | ICD-10-CM | POA: Diagnosis not present

## 2019-09-11 DIAGNOSIS — M542 Cervicalgia: Secondary | ICD-10-CM | POA: Diagnosis not present

## 2019-09-20 ENCOUNTER — Other Ambulatory Visit: Payer: Self-pay | Admitting: Family Medicine

## 2019-09-20 DIAGNOSIS — F4010 Social phobia, unspecified: Secondary | ICD-10-CM

## 2019-09-29 ENCOUNTER — Other Ambulatory Visit: Payer: Self-pay | Admitting: Family Medicine

## 2019-09-29 DIAGNOSIS — F4010 Social phobia, unspecified: Secondary | ICD-10-CM

## 2019-10-08 ENCOUNTER — Other Ambulatory Visit: Payer: Self-pay | Admitting: Family Medicine

## 2019-10-08 DIAGNOSIS — F4010 Social phobia, unspecified: Secondary | ICD-10-CM

## 2019-12-09 ENCOUNTER — Encounter: Payer: Self-pay | Admitting: Family Medicine

## 2019-12-10 MED ORDER — AMPHETAMINE-DEXTROAMPHET ER 20 MG PO CP24: 20.0000 mg | ORAL_CAPSULE | ORAL | 0 refills | Status: DC

## 2019-12-10 NOTE — Addendum Note (Signed)
Addended by: Abbe Amsterdam C on: 12/10/2019 07:21 AM   Modules accepted: Orders

## 2019-12-14 ENCOUNTER — Telehealth: Payer: Self-pay

## 2019-12-14 NOTE — Telephone Encounter (Signed)
Generic amphetamine-dextroamphetamine not covered by insurance. Preferred is branded Adderall XR.

## 2019-12-15 MED ORDER — ADDERALL XR 20 MG PO CP24: ORAL_CAPSULE | ORAL | 0 refills | Status: DC

## 2019-12-16 ENCOUNTER — Telehealth: Payer: Self-pay

## 2019-12-16 NOTE — Telephone Encounter (Signed)
PA initiated via Covermymeds; KEY: [id number]. Awaiting determination.

## 2019-12-21 ENCOUNTER — Encounter: Payer: Self-pay | Admitting: Family Medicine

## 2019-12-21 NOTE — Telephone Encounter (Signed)
PA denied for 2 capsules daily (as this is higher than the approved daily FDA recommendation)- however his insurance will cover 1 capsule daily.

## 2019-12-29 ENCOUNTER — Telehealth: Payer: Self-pay | Admitting: Family Medicine

## 2019-12-29 NOTE — Telephone Encounter (Signed)
Patient's mother about her son's medication and insurance approval. I let her know she was not on his DPR to release information. Information that was given was not matching what his chart showed. Mother stated her son didn't have mychart and didn't know how to get in touch with his provider. According the chart patient does have mychart and has been in contact with provider recently about meds. Mom stated her child had a disability and he was at work and she didn't want to bother or stress him out by seeing he could give a verbal approval to speak to her. She asked how could she get on his chart to get information I explained that  Her son would need to come into the office and signed the release form. Mother was very upset and began to scream when I wasn't able to provide her any info.

## 2020-01-07 ENCOUNTER — Encounter: Payer: Self-pay | Admitting: Family Medicine

## 2020-01-08 ENCOUNTER — Telehealth: Payer: Self-pay

## 2020-01-08 DIAGNOSIS — F900 Attention-deficit hyperactivity disorder, predominantly inattentive type: Secondary | ICD-10-CM

## 2020-01-08 MED ORDER — AMPHETAMINE-DEXTROAMPHET ER 20 MG PO CP24: 40.0000 mg | ORAL_CAPSULE | ORAL | 0 refills | Status: DC

## 2020-01-08 NOTE — Addendum Note (Signed)
Addended by: Abbe Amsterdam C on: 01/08/2020 11:36 AM   Modules accepted: Orders

## 2020-01-08 NOTE — Telephone Encounter (Signed)
Spoke with [employer]. I did resend more information opening a "PCR" for the medication. The PCR was opened and under review sent to pharmacist. However when I spoke with the representative at [facility] she did say even marked as an urgent request the outcome can take up to 7 days. I did explain this is situation with the patient that needs to be resolved as quickly as possible. She gave the option for a peer to peer.   Dr. Patsy Lager the line is [phone] Option 3, Option 1.  Case# [id number]

## 2020-01-08 NOTE — Telephone Encounter (Signed)
12/15/2019  2   12/15/2019  Adderall Xr 20 MG Capsule  30.00  30 Je Cop   [id number]   [place] (2868)   0/0   Comm Ins   [place]  12/09/2019  2   10/08/2019  Lorazepam 1 MG Tablet  20.00  20 Je Cop   [id number]   [place] (2868)   2/2  1.00 LME  Comm Ins   [place]  11/07/2019  2   09/07/2019  Dextroamp-Amphetamin 20 MG Tab  60.00  30 Je Cop   [id number]   [place] (2868)   0/0   Comm Ins   [place]  11/07/2019  2   10/08/2019  Lorazepam 1 MG Tablet  20.00  20 Je Cop   [id number]   [place] (2868)   1/2  1.00 LME  Comm Ins   [place]  10/08/2019  2   09/07/2019  Dextroamp-Amphetamin 20 MG Tab  60.00  30 Je Cop   [id number]   [place] (2868)   0/0   Comm Ins   [place]  10/08/2019  2   10/08/2019  Lorazepam 1 MG Tablet  20.00  20 Je Cop   [id number]   [place] (2868)   0/2  1.00 LME  Comm Ins   [place]  09/07/2019  2   09/07/2019  Dextroamp-Amphetamin 20 MG Tab  60.00  30 Je Cop   [id number]   [place] (2868)   0/0   Comm Ins   [place]  08/29/2019  2   06/01/2019  Lorazepam 1 MG Tablet  20.00  20 Je Cop   [id number]   [place] (2868)   2/2  1.00 LME  Comm Ins   [place]  07/30/2019  2   05/30/2019  Dextroamp-Amphetamin 10 MG Tab  90.00  30 Je Cop   [id number]   [place] (2868)   0/0   Comm Ins   [place]  07/13/2019  2   06/01/2019  Lorazepam 1 MG Tablet  20.00  20 Je Cop   [id number]   [place] (2868)   1/2  1.00 LME  Comm Ins   [place]  06/29/2019  2   05/30/2019  Dextroamp-Amphetamin 10 MG Tab  90.00  30 Je Cop   [id number]   [place] (2868)   0/0   Comm Ins      Called to set up peer to peer and was able to speak with clinical pharmacist Asma B I was able to get an approval for adderallxr 20 BID Per Aasma, we do not need an approval number, I just send the rx to his pharmacy

## 2020-01-19 ENCOUNTER — Other Ambulatory Visit: Payer: Self-pay | Admitting: Family Medicine

## 2020-01-19 DIAGNOSIS — F4010 Social phobia, unspecified: Secondary | ICD-10-CM

## 2020-02-09 ENCOUNTER — Other Ambulatory Visit: Payer: Self-pay | Admitting: Family Medicine

## 2020-02-09 DIAGNOSIS — F900 Attention-deficit hyperactivity disorder, predominantly inattentive type: Secondary | ICD-10-CM

## 2020-02-09 MED ORDER — AMPHETAMINE-DEXTROAMPHET ER 20 MG PO CP24: 40.0000 mg | ORAL_CAPSULE | ORAL | 0 refills | Status: DC

## 2020-02-09 NOTE — Telephone Encounter (Signed)
  Requesting:Adderall  Contract:none UDS: 02/06/2018 Last Visit:09/07/2019 Next Visit: none Last Refill: 01/08/2020  Please Advise

## 2020-04-09 ENCOUNTER — Encounter: Payer: Self-pay | Admitting: Family Medicine

## 2020-04-09 DIAGNOSIS — F900 Attention-deficit hyperactivity disorder, predominantly inattentive type: Secondary | ICD-10-CM

## 2020-04-09 MED ORDER — AMPHETAMINE-DEXTROAMPHET ER 20 MG PO CP24: 40.0000 mg | ORAL_CAPSULE | ORAL | 0 refills | Status: DC

## 2020-04-20 NOTE — Progress Notes (Signed)
[facility] at [facility] [address] [place], [place] [address] [phone]  Date:  04/21/2020   Name:  Bob Hodges   DOB:  1990/09/08   MRN:  [id number]  PCP:  Bob Cables, MD    Chief Complaint: ADHD   History of Present Illness:  Bob Hodges is a 29 y.o. very pleasant male patient who presents with the following:  Patient today for a follow-up visit History of ADHD, social anxiety He is currently taking Adderall XR 20- one at 0515 and 2nd dose around 9 am- he is getting to sleep ok at night and his appetiee is ok.  He feels he is doing very good with his current dosage  He would like routine STI screening today; most recent screen about 18 months ago, negative No particular exposures or symptoms We did other routine lab work in March of this year  COVID-19 series- he got this done already Flu vaccine- pt declines  Routine labs done in March of this year  Isidor states he would be interested in speaking with a psychologist about possible autism spectrum disorder.  We went over a few options that might be helpful for him  04/10/2020  2   04/09/2020  Adderall Xr 20 Mg Capsule  60.00 30 Je Cop  [id number]  [place] ([id number])  0/0  Comm Ins  [place]  04/09/2020  2   01/20/2020  Lorazepam 1 Mg Tablet  20.00 20 Je Cop  [id number]  [place] ([id number])  2/2 1.00 LME Comm Ins  [place]  03/13/2020  2   12/10/2019  Adderall Xr 20 Mg Capsule  60.00 30 Je Cop  [id number]  [place] ([id number])  0/0  Comm Ins  [place]  02/28/2020  2   01/20/2020  Lorazepam 1 Mg Tablet  20.00 20 Je Cop  [id number]  [place] ([id number])  1/2 1.00 LME Comm Ins  [place]  02/09/2020  2   02/09/2020  Adderall Xr 20 Mg Capsule  60.00 30 Je Cop  [id number]  [place] ([id number])  0/0  Comm Ins  [place]  01/20/2020  2   01/20/2020  Lorazepam 1 Mg Tablet  20.00 20 Je Cop  [id number]  [place] ([id number])  0/2 1.00 LME Comm Ins  [place]  01/08/2020  2   01/08/2020  Adderall Xr 20 Mg Capsule  60.00 30 Je Cop  [id number]  [place] ([id number])  0/0  Comm Ins  [place]   12/15/2019  2   12/15/2019  Adderall Xr 20 Mg Capsule  30.00 30 Je Cop  [id number]  [place] ([id number])  0/0  Comm Ins  [place]  12/09/2019  2   10/08/2019  Lorazepam 1 Mg Tablet  20.00 20 Je Cop  [id number]  [place] ([id number])  2/2 1.00 LME Comm Ins  [place]  11/07/2019  2   10/08/2019  Lorazepam 1 Mg Tablet  20.00 20 Je Cop  [id number]  [place] ([id number])  1/2 1.00 LME Comm Ins  [place]  11/07/2019  2   09/07/2019  Dextroamp-Amphetamin 20 Mg Tab  60.00 30 Je Cop  [id number]  [place] ([id number])  0/0  Comm Ins  [place]  10/08/2019  2   10/08/2019  Lorazepam 1 Mg Tablet  20.00 20 Je Cop  [id number]  [place] ([id number])  0/2 1.00 LME      Patient Active Problem List   Diagnosis Date Noted  . Social anxiety disorder 03/22/2016  . Muscle pain 03/22/2016  . Other seasonal  allergic rhinitis 03/22/2016  . Tobacco abuse 02/07/2012  . ACNE VULGARIS 06/05/2010  . ADHD 04/13/2008    Past Medical History:  Diagnosis Date  . ADHD (attention deficit hyperactivity disorder)     History reviewed. No pertinent surgical history.  Social History   Tobacco Use  . Smoking status: Current Every Day Smoker    Packs/day: 0.50    Years: 4.00    Pack years: 2.00    Types: Cigarettes  . Smokeless tobacco: Never Used  Substance Use Topics  . Alcohol use: Yes  . Drug use: No    Family History  Problem Relation Age of Onset  . Lupus Mother     Allergies  Allergen Reactions  . Naproxen Other (See Comments)    Tunnel vision, blacked out  . Tylenol [Acetaminophen] Hives and Shortness Of Breath    Medication list has been reviewed and updated.  Current Outpatient Medications on File Prior to Visit  Medication Sig Dispense Refill  . Acetylcysteine (NAC) 600 MG CAPS Take 1,200 mg by mouth in the morning and at bedtime.    . ADDERALL XR 20 MG 24 hr capsule Take one capsule each morning.  May take a 2nd dose if needed 60 capsule 0  . amphetamine-dextroamphetamine (ADDERALL XR) 20 MG 24 hr capsule Take 1 capsule (20 mg total) by mouth every morning. May take 2nd dose daily  if needed 60 capsule 0  . amphetamine-dextroamphetamine (ADDERALL XR) 20 MG 24 hr capsule Take 2 capsules (40 mg total) by mouth every morning. 60 capsule 0  . amphetamine-dextroamphetamine (ADDERALL) 20 MG tablet Take 1 tablet (20 mg total) by mouth 2 (two) times daily. To fill in 60 days 60 tablet 0  . amphetamine-dextroamphetamine (ADDERALL) 20 MG tablet Take 1 tablet (20 mg total) by mouth 2 (two) times daily. To fill in 30 days 60 tablet 0  . amphetamine-dextroamphetamine (ADDERALL) 20 MG tablet Take 1 tablet (20 mg total) by mouth 2 (two) times daily. 60 tablet 0  . ibuprofen (ADVIL,MOTRIN) 200 MG tablet Take 200 mg by mouth every 6 (six) hours as needed for pain (PAIN).    Marland Kitchen ipratropium (ATROVENT) 0.03 % nasal spray Place 2 sprays into both nostrils 4 (four) times daily. 30 mL 12  . LORazepam (ATIVAN) 1 MG tablet TAKE 1/2 TO 1 TABLET PRIOR TO SOCIAL OCCASION AS NEEDED FOR ANXIETY 20 tablet 2   No current facility-administered medications on file prior to visit.    Review of Systems:  As per HPI- otherwise negative.   Physical Examination: Vitals:   04/21/20 1601  BP: 138/76  Pulse: 93  Resp: 20  Temp: 98.7 F (37.1 C)  SpO2: 99%   Vitals:   04/21/20 1601  Weight: 178 lb 6.4 oz (80.9 kg)  Height: 6\' 2"  (1.88 m)   Body mass index is 22.91 kg/m. Ideal Body Weight: Weight in (lb) to have BMI = 25: 194.3  GEN: no acute distress.  Normal weight, looks well HEENT: Atraumatic, Normocephalic.  Ears and Nose: No external deformity. CV: RRR, No M/G/R. No JVD. No thrill. No extra heart sounds. PULM: CTA B, no wheezes, crackles, rhonchi. No retractions. No resp. distress. No accessory muscle use. ABD: S, NT, ND, +BS. No rebound. No HSM. EXTR: No c/c/e PSYCH: Normally interactive. Conversant.   Pulse Readings from Last 3 Encounters:  04/21/20 93  09/07/19 97  02/18/19 (!) 108    Assessment and Plan: ADHD (attention deficit hyperactivity disorder), inattentive type - Plan:  amphetamine-dextroamphetamine (  ADDERALL XR) 20 MG 24 hr capsule  Routine screening for STI (sexually transmitted infection) - Plan: Hepatitis C antibody, HIV Antibody (routine testing w rflx), RPR, Hepatitis B surface antigen, Urine cytology ancillary only([place])  Refilled Adderall XR, patient takes 20 mg twice daily Routine STI screening test pending Discussed a couple of options for counselors This visit occurred during the SARS-CoV-2 public health emergency.  Safety protocols were in place, including screening questions prior to the visit, additional usage of staff PPE, and extensive cleaning of exam room while observing appropriate contact time as indicated for disinfecting solutions.    Signed Abbe Amsterdam, MD

## 2020-04-21 ENCOUNTER — Ambulatory Visit: Payer: BC Managed Care – PPO | Admitting: Family Medicine

## 2020-04-21 ENCOUNTER — Other Ambulatory Visit (HOSPITAL_COMMUNITY)
Admission: RE | Admit: 2020-04-21 | Discharge: 2020-04-21 | Disposition: A | Discharge status: Home / Self Care | Payer: BC Managed Care – PPO | Source: Ambulatory Visit | Attending: Family Medicine | Admitting: Family Medicine

## 2020-04-21 ENCOUNTER — Other Ambulatory Visit: Payer: Self-pay

## 2020-04-21 ENCOUNTER — Encounter: Payer: Self-pay | Admitting: Family Medicine

## 2020-04-21 VITALS — BP 138/76 | HR 93 | Temp 98.7°F | Resp 20 | Ht 74.0 in | Wt 178.4 lb

## 2020-04-21 DIAGNOSIS — Z113 Encounter for screening for infections with a predominantly sexual mode of transmission: Secondary | ICD-10-CM | POA: Diagnosis not present

## 2020-04-21 DIAGNOSIS — F900 Attention-deficit hyperactivity disorder, predominantly inattentive type: Secondary | ICD-10-CM | POA: Diagnosis not present

## 2020-04-21 MED ORDER — AMPHETAMINE-DEXTROAMPHET ER 20 MG PO CP24: 40.0000 mg | ORAL_CAPSULE | Freq: Every day | ORAL | 0 refills | Status: DC

## 2020-04-21 MED ORDER — AMPHETAMINE-DEXTROAMPHET ER 20 MG PO CP24: 40.0000 mg | ORAL_CAPSULE | ORAL | 0 refills | Status: DC

## 2020-04-21 NOTE — Patient Instructions (Signed)
Good to see you again today-  I will be in touch with your labs asap Please see me in about 6 months You might check out the Autism society of - they have a specialist in our area who may be able to help guide you.  The issue is finding someone who works in Water quality scientist of adults.    [facility] may also be helpful, also perhaps the [facility]

## 2020-04-25 ENCOUNTER — Encounter: Payer: Self-pay | Admitting: Family Medicine

## 2020-04-25 LAB — URINE CYTOLOGY ANCILLARY ONLY
Chlamydia: NEGATIVE
Comment: NEGATIVE
Comment: NORMAL
Neisseria Gonorrhea: NEGATIVE

## 2020-04-25 LAB — HIV ANTIBODY (ROUTINE TESTING W REFLEX): HIV 1&2 Ab, 4th Generation: NONREACTIVE

## 2020-04-25 LAB — RPR: RPR Ser Ql: NONREACTIVE

## 2020-04-25 LAB — HEPATITIS B SURFACE ANTIGEN: Hepatitis B Surface Ag: NONREACTIVE

## 2020-04-25 LAB — HEPATITIS C ANTIBODY
Hepatitis C Ab: NONREACTIVE
SIGNAL TO CUT-OFF: 0.01 (ref <1.00)

## 2020-04-27 ENCOUNTER — Encounter: Payer: Self-pay | Admitting: Family Medicine

## 2020-06-10 ENCOUNTER — Other Ambulatory Visit: Payer: Self-pay | Admitting: Family Medicine

## 2020-06-10 DIAGNOSIS — F4010 Social phobia, unspecified: Secondary | ICD-10-CM

## 2020-06-13 NOTE — Telephone Encounter (Signed)
Requesting: lorazepam 1mg  Contract: None UDS: None Last Visit: 04/21/2020 Next Visit: None Last Refill: 01/20/2020 #20 and 2RF  Please Advise

## 2020-07-10 ENCOUNTER — Other Ambulatory Visit: Payer: Self-pay | Admitting: Family Medicine

## 2020-07-10 DIAGNOSIS — F900 Attention-deficit hyperactivity disorder, predominantly inattentive type: Secondary | ICD-10-CM

## 2020-07-11 ENCOUNTER — Encounter: Payer: Self-pay | Admitting: Family Medicine

## 2020-07-11 MED ORDER — AMPHETAMINE-DEXTROAMPHET ER 20 MG PO CP24: 40.0000 mg | ORAL_CAPSULE | ORAL | 0 refills | Status: DC

## 2020-07-11 MED ORDER — AMPHETAMINE-DEXTROAMPHET ER 20 MG PO CP24: 40.0000 mg | ORAL_CAPSULE | Freq: Every day | ORAL | 0 refills | Status: DC

## 2020-09-10 ENCOUNTER — Other Ambulatory Visit: Payer: Self-pay | Admitting: Family Medicine

## 2020-09-10 DIAGNOSIS — F4010 Social phobia, unspecified: Secondary | ICD-10-CM

## 2020-09-12 NOTE — Telephone Encounter (Signed)
Requesting: lorazepam Contract: 02/06/18 UDS: 02/06/18 Last Visit: 04/21/20 Next Visit: none Last Refill: 06/13/20  Please Advise

## 2020-10-14 ENCOUNTER — Other Ambulatory Visit: Payer: Self-pay | Admitting: Family Medicine

## 2020-10-14 DIAGNOSIS — F900 Attention-deficit hyperactivity disorder, predominantly inattentive type: Secondary | ICD-10-CM

## 2020-10-17 ENCOUNTER — Encounter: Payer: Self-pay | Admitting: Family Medicine

## 2020-10-17 MED ORDER — AMPHETAMINE-DEXTROAMPHET ER 20 MG PO CP24: 40.0000 mg | ORAL_CAPSULE | ORAL | 0 refills | Status: DC

## 2020-11-12 NOTE — Progress Notes (Signed)
[facility] at [facility] [address] [place], [place] [address] [phone]  Date:  11/16/2020   Name:  Bob Hodges   DOB:  03-Nov-1990   MRN:  [id number]  PCP:  Pearline Cables, MD    Chief Complaint: Annual Exam (cpe/With rx follow up) and worried about a wart (It is growing on the left index finger)   History of Present Illness:  Bob Hodges is a 30 y.o. very pleasant male patient who presents with the following:  Here today for a physical exam, and to discuss medications Last visit with myself in October History of ADHD and social anxiety He is doing well since our last visit, no major changes   At his last visit, he was taking Adderall XR 20 mg twice daily and doing well.  He notes that this medication is still working well for him, but he is interested in trying Vyvanse once daily for a month to see if this may also be successful.  We can certainly give this a try  COVID-19 booster-not done yet, encouraged Tetanus up-to-date Can offer Gardasil-for the time being he declines Most recent labs about 1 year ago Discussed minimal leukocytosis at last labs.  He declines recheck today  He did have a wart on his foot recently, he was able to treat this at home.  However he now notes warts on his left index finger, started with 1 and has spread into a small cluster  He would be interested in cryotherapy today  10/17/2020  10/17/2020   1  Dextroamp-Amphet Er 20 Mg Cap  60.00  30  Je Cop  [id number]  Nor ([id number])  0/0   Comm Ins  [place]    10/13/2020  09/12/2020   1  Lorazepam 1 Mg Tablet  20.00  20  Je Cop  [id number]  [place] ([id number])  1/1  1.00 LME  Comm Ins  [place]    09/12/2020  07/11/2020   1  Dextroamp-Amphet Er 20 Mg Cap  60.00  30  Je Cop  [id number]  [place] ([id number])  0/0   Comm Ins  [place]    09/12/2020  09/12/2020   1  Lorazepam 1 Mg Tablet  20.00  20  Je Cop  [id number]  [place] ([id number])  0/1  1.00 LME  Comm Ins  [place]    08/11/2020  06/13/2020   1   Lorazepam 1 Mg Tablet  20.00  20  Je Cop  [id number]  [place] ([id number])  2/2  1.00 LME  Comm Ins  [place]    08/11/2020  07/11/2020   1  Dextroamp-Amphet Er 20 Mg Cap  60.00  30  Je Cop  [id number]  [place] ([id number])  0/0   Comm Ins      07/11/2020  07/11/2020   1  Dextroamp-Amphet Er 20 Mg Cap  60.00  30  Je Cop  [id number]         Patient Active Problem List   Diagnosis Date Noted  . Social anxiety disorder 03/22/2016  . Muscle pain 03/22/2016  . Other seasonal allergic rhinitis 03/22/2016  . Tobacco abuse 02/07/2012  . ACNE VULGARIS 06/05/2010  . ADHD 04/13/2008    Past Medical History:  Diagnosis Date  . ADHD (attention deficit hyperactivity disorder)     No past surgical history on file.  Social History   Tobacco Use  . Smoking status: Current Every Day Smoker    Packs/day:  0.50    Years: 4.00    Pack years: 2.00    Types: Cigarettes  . Smokeless tobacco: Never Used  Substance Use Topics  . Alcohol use: Yes  . Drug use: No    Family History  Problem Relation Age of Onset  . Lupus Mother     Allergies  Allergen Reactions  . Naproxen Other (See Comments)    Tunnel vision, blacked out  . Tylenol [Acetaminophen] Hives and Shortness Of Breath    Medication list has been reviewed and updated.  Current Outpatient Medications on File Prior to Visit  Medication Sig Dispense Refill  . Acetylcysteine (NAC) 600 MG CAPS Take 1,200 mg by mouth in the morning and at bedtime.    . ADDERALL XR 20 MG 24 hr capsule Take one capsule each morning.  May take a 2nd dose if needed 60 capsule 0  . amphetamine-dextroamphetamine (ADDERALL XR) 20 MG 24 hr capsule Take 2 capsules (40 mg total) by mouth every morning. 60 capsule 0  . amphetamine-dextroamphetamine (ADDERALL XR) 20 MG 24 hr capsule Take 2 capsules (40 mg total) by mouth daily. To fill in 30 days 60 capsule 0  . amphetamine-dextroamphetamine (ADDERALL XR) 20 MG 24 hr capsule Take 2 capsules (40 mg total) by mouth every morning. 60 capsule 0  .  amphetamine-dextroamphetamine (ADDERALL) 20 MG tablet Take 1 tablet (20 mg total) by mouth 2 (two) times daily. To fill in 60 days 60 tablet 0  . amphetamine-dextroamphetamine (ADDERALL) 20 MG tablet Take 1 tablet (20 mg total) by mouth 2 (two) times daily. To fill in 30 days 60 tablet 0  . amphetamine-dextroamphetamine (ADDERALL) 20 MG tablet Take 1 tablet (20 mg total) by mouth 2 (two) times daily. 60 tablet 0  . ibuprofen (ADVIL,MOTRIN) 200 MG tablet Take 200 mg by mouth every 6 (six) hours as needed for pain (PAIN).    Marland Kitchen ipratropium (ATROVENT) 0.03 % nasal spray Place 2 sprays into both nostrils 4 (four) times daily. 30 mL 12  . LORazepam (ATIVAN) 1 MG tablet TAKE 1/2 TO 1 TABLET BY MOUTH PRIOR TO SOCIAL OCCASION AS NEEDED FOR ANXIETY 20 tablet 1   No current facility-administered medications on file prior to visit.    Review of Systems:  As per HPI- otherwise negative.   Physical Examination: Vitals:   11/16/20 1509  BP: 140/82  Pulse: 86  Temp: 97.7 F (36.5 C)  SpO2: 98%   Vitals:   11/16/20 1509  Weight: 171 lb (77.6 kg)  Height: 6\' 2"  (1.88 m)   Body mass index is 21.96 kg/m. Ideal Body Weight: Weight in (lb) to have BMI = 25: 194.3  GEN: no acute distress.  Looks well and his normal self HEENT: Atraumatic, Normocephalic.   Bilateral TM wnl, oropharynx normal.  PEERL,EOMI.   Ears and Nose: No external deformity. CV: RRR, No M/G/R. No JVD. No thrill. No extra heart sounds. PULM: CTA B, no wheezes, crackles, rhonchi. No retractions. No resp. distress. No accessory muscle use. ABD: S, NT, ND, +BS. No rebound. No HSM. EXTR: No c/c/e PSYCH: Normally interactive. Conversant.   There is a small cluster of warts on the dorsum of the left index finger, 1 main wart and 3 smaller satellite warts.  Verbal consent obtained, discussed risks, benefits and alternatives.  Cryotherapy applied to warts x3 cycles, no immediate complications Assessment and Plan: Physical  exam  Mild intermittent reactive airway disease without complication - Plan: albuterol (VENTOLIN HFA) 108 (90 Base) MCG/ACT inhaler  ADHD (attention deficit hyperactivity disorder), inattentive type - Plan: lisdexamfetamine (VYVANSE) 40 MG capsule  Social anxiety disorder  Viral wart on finger   Patient seen today for physical exam. 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  He is already wearing mask when he is exposed to chemicals.  I prescribed albuterol for him to use as needed  We will have him try Vyvanse 40 mg in place of Adderall for 1 month, he will let me know how this works for him  Treat viral warts as above  Recommended COVID booster Physical exam today.  Encouraged healthy diet and regular exercise   This visit occurred during the SARS-CoV-2 public health emergency.  Safety protocols were in place, including screening questions prior to the visit, additional usage of staff PPE, and extensive cleaning of exam room while observing appropriate contact time as indicated for disinfecting solutions.    Signed Abbe Amsterdam, MD

## 2020-11-16 ENCOUNTER — Encounter: Payer: Self-pay | Admitting: Family Medicine

## 2020-11-16 ENCOUNTER — Other Ambulatory Visit: Payer: Self-pay

## 2020-11-16 ENCOUNTER — Ambulatory Visit (INDEPENDENT_AMBULATORY_CARE_PROVIDER_SITE_OTHER): Payer: BC Managed Care – PPO | Admitting: Family Medicine

## 2020-11-16 VITALS — BP 140/82 | HR 86 | Temp 97.7°F | Ht 74.0 in | Wt 171.0 lb

## 2020-11-16 DIAGNOSIS — F4010 Social phobia, unspecified: Secondary | ICD-10-CM

## 2020-11-16 DIAGNOSIS — B079 Viral wart, unspecified: Secondary | ICD-10-CM

## 2020-11-16 DIAGNOSIS — Z0000 Encounter for general adult medical examination without abnormal findings: Secondary | ICD-10-CM | POA: Diagnosis not present

## 2020-11-16 DIAGNOSIS — J4520 Mild intermittent asthma, uncomplicated: Secondary | ICD-10-CM | POA: Diagnosis not present

## 2020-11-16 DIAGNOSIS — F900 Attention-deficit hyperactivity disorder, predominantly inattentive type: Secondary | ICD-10-CM

## 2020-11-16 MED ORDER — LISDEXAMFETAMINE DIMESYLATE 40 MG PO CAPS: 40.0000 mg | ORAL_CAPSULE | ORAL | 0 refills | Status: DC

## 2020-11-16 MED ORDER — ALBUTEROL SULFATE HFA 108 (90 BASE) MCG/ACT IN AERS: 2.0000 | INHALATION_SPRAY | Freq: Four times a day (QID) | RESPIRATORY_TRACT | 3 refills | Status: AC | PRN

## 2020-11-16 NOTE — Patient Instructions (Signed)
It was good to see you again today!    Please consider getting a covid 19 booster- I think this would be a good idea Use the albuterol inhaler as needed for wheezing We will try Vyvanse 40 mg for you for a month- let me know what you think   Health Maintenance, Male Adopting a healthy lifestyle and getting preventive care are important in promoting health and wellness. Ask your health care provider about:  The right schedule for you to have regular tests and exams.  Things you can do on your own to prevent diseases and keep yourself healthy. What should I know about diet, weight, and exercise? Eat a healthy diet  Eat a diet that includes plenty of vegetables, fruits, low-fat dairy products, and lean protein.  Do not eat a lot of foods that are high in solid fats, added sugars, or sodium.   Maintain a healthy weight Body mass index (BMI) is a measurement that can be used to identify possible weight problems. It estimates body fat based on height and weight. Your health care provider can help determine your BMI and help you achieve or maintain a healthy weight. Get regular exercise Get regular exercise. This is one of the most important things you can do for your health. Most adults should:  Exercise for at least 150 minutes each week. The exercise should increase your heart rate and make you sweat (moderate-intensity exercise).  Do strengthening exercises at least twice a week. This is in addition to the moderate-intensity exercise.  Spend less time sitting. Even light physical activity can be beneficial. Watch cholesterol and blood lipids Have your blood tested for lipids and cholesterol at 29 years of age, then have this test every 5 years. You may need to have your cholesterol levels checked more often if:  Your lipid or cholesterol levels are high.  You are older than 29 years of age.  You are at high risk for heart disease. What should I know about cancer screening? Many  types of cancers can be detected early and may often be prevented. Depending on your health history and family history, you may need to have cancer screening at various ages. This may include screening for:  Colorectal cancer.  Prostate cancer.  Skin cancer.  Lung cancer. What should I know about heart disease, diabetes, and high blood pressure? Blood pressure and heart disease  High blood pressure causes heart disease and increases the risk of stroke. This is more likely to develop in people who have high blood pressure readings, are of African descent, or are overweight.  Talk with your health care provider about your target blood pressure readings.  Have your blood pressure checked: ? Every 3-5 years if you are 74-41 years of age. ? Every year if you are 73 years old or older.  If you are between the ages of 75 and 71 and are a current or former smoker, ask your health care provider if you should have a one-time screening for abdominal aortic aneurysm (AAA). Diabetes Have regular diabetes screenings. This checks your fasting blood sugar level. Have the screening done:  Once every three years after age 66 if you are at a normal weight and have a low risk for diabetes.  More often and at a younger age if you are overweight or have a high risk for diabetes. What should I know about preventing infection? Hepatitis B If you have a higher risk for hepatitis B, you should be screened for  this virus. Talk with your health care provider to find out if you are at risk for hepatitis B infection. Hepatitis C Blood testing is recommended for:  Everyone born from [birth year] through 1964.  Anyone with known risk factors for hepatitis C. Sexually transmitted infections (STIs)  You should be screened each year for STIs, including gonorrhea and chlamydia, if: ? You are sexually active and are younger than 29 years of age. ? You are older than 29 years of age and your health care provider tells you  that you are at risk for this type of infection. ? Your sexual activity has changed since you were last screened, and you are at increased risk for chlamydia or gonorrhea. Ask your health care provider if you are at risk.  Ask your health care provider about whether you are at high risk for HIV. Your health care provider may recommend a prescription medicine to help prevent HIV infection. If you choose to take medicine to prevent HIV, you should first get tested for HIV. You should then be tested every 3 months for as long as you are taking the medicine. Follow these instructions at home: Lifestyle  Do not use any products that contain nicotine or tobacco, such as cigarettes, e-cigarettes, and chewing tobacco. If you need help quitting, ask your health care provider.  Do not use street drugs.  Do not share needles.  Ask your health care provider for help if you need support or information about quitting drugs. Alcohol use  Do not drink alcohol if your health care provider tells you not to drink.  If you drink alcohol: ? Limit how much you have to 0-2 drinks a day. ? Be aware of how much alcohol is in your drink. In the [place] one drink equals one 12 oz bottle of beer (355 mL), one 5 oz glass of wine (148 mL), or one 1 oz glass of hard liquor (44 mL). General instructions  Schedule regular health, dental, and eye exams.  Stay current with your vaccines.  Tell your health care provider if: ? You often feel depressed. ? You have ever been abused or do not feel safe at home. Summary  Adopting a healthy lifestyle and getting preventive care are important in promoting health and wellness.  Follow your health care provider's instructions about healthy diet, exercising, and getting tested or screened for diseases.  Follow your health care provider's instructions on monitoring your cholesterol and blood pressure. This information is not intended to replace advice given to you by your  health care provider. Make sure you discuss any questions you have with your health care provider. Document Revised: 06/11/2018 Document Reviewed: 06/11/2018 Elsevier Patient Education  2021 [employer].

## 2020-11-21 ENCOUNTER — Other Ambulatory Visit: Payer: Self-pay | Admitting: Family Medicine

## 2020-11-21 DIAGNOSIS — F4010 Social phobia, unspecified: Secondary | ICD-10-CM

## 2020-12-16 ENCOUNTER — Encounter: Payer: Self-pay | Admitting: Family Medicine

## 2020-12-17 MED ORDER — LISDEXAMFETAMINE DIMESYLATE 50 MG PO CAPS: 50.0000 mg | ORAL_CAPSULE | Freq: Every day | ORAL | 0 refills | Status: DC

## 2021-01-26 ENCOUNTER — Other Ambulatory Visit: Payer: Self-pay | Admitting: Family Medicine

## 2021-01-26 DIAGNOSIS — F900 Attention-deficit hyperactivity disorder, predominantly inattentive type: Secondary | ICD-10-CM

## 2021-01-26 MED ORDER — LISDEXAMFETAMINE DIMESYLATE 50 MG PO CAPS: 50.0000 mg | ORAL_CAPSULE | Freq: Every day | ORAL | 0 refills | Status: DC

## 2021-01-26 NOTE — Telephone Encounter (Signed)
Requesting: vyvanse Last Visit: 11/16/20 Next Visit: none Last Refill: 12/17/20  Please Advise

## 2021-03-30 ENCOUNTER — Encounter: Payer: Self-pay | Admitting: Family Medicine

## 2021-03-30 MED ORDER — LISDEXAMFETAMINE DIMESYLATE 60 MG PO CAPS: 60.0000 mg | ORAL_CAPSULE | Freq: Every day | ORAL | 0 refills | Status: AC
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
